# Patient Record
Sex: Female | Born: 1970 | Race: White | Hispanic: No | Marital: Married | Smoking: Current every day smoker
Health system: Southern US, Community
[De-identification: ages and names within clinical notes are randomized; demographics above are authoritative.]

## PROBLEM LIST (undated history)

## (undated) DIAGNOSIS — G43909 Migraine, unspecified, not intractable, without status migrainosus: Secondary | ICD-10-CM

## (undated) DIAGNOSIS — R51 Headache: Secondary | ICD-10-CM

## (undated) DIAGNOSIS — Z720 Tobacco use: Secondary | ICD-10-CM

## (undated) DIAGNOSIS — E039 Hypothyroidism, unspecified: Secondary | ICD-10-CM

## (undated) DIAGNOSIS — R519 Headache, unspecified: Secondary | ICD-10-CM

## (undated) DIAGNOSIS — C73 Malignant neoplasm of thyroid gland: Secondary | ICD-10-CM

## (undated) DIAGNOSIS — K219 Gastro-esophageal reflux disease without esophagitis: Secondary | ICD-10-CM

## (undated) DIAGNOSIS — C801 Malignant (primary) neoplasm, unspecified: Secondary | ICD-10-CM

## (undated) HISTORY — DX: Migraine, unspecified, not intractable, without status migrainosus: G43.909

## (undated) HISTORY — DX: Tobacco use: Z72.0

## (undated) HISTORY — DX: Malignant (primary) neoplasm, unspecified: C80.1

## (undated) HISTORY — DX: Hypothyroidism, unspecified: E03.9

## (undated) HISTORY — PX: ABDOMINAL HYSTERECTOMY: SHX81

## (undated) HISTORY — DX: Malignant neoplasm of thyroid gland: C73

## (undated) HISTORY — PX: DILATION AND CURETTAGE OF UTERUS: SHX78

## (undated) HISTORY — PX: KNEE ARTHROSCOPY: SUR90

---

## 1999-08-01 DIAGNOSIS — C73 Malignant neoplasm of thyroid gland: Secondary | ICD-10-CM

## 1999-08-01 HISTORY — PX: OTHER SURGICAL HISTORY: SHX169

## 1999-08-01 HISTORY — DX: Malignant neoplasm of thyroid gland: C73

## 2006-07-27 ENCOUNTER — Emergency Department: Payer: Self-pay | Admitting: Emergency Medicine

## 2008-11-24 ENCOUNTER — Ambulatory Visit: Payer: Self-pay | Admitting: Unknown Physician Specialty

## 2010-09-24 ENCOUNTER — Ambulatory Visit: Payer: Self-pay | Admitting: Internal Medicine

## 2011-05-30 ENCOUNTER — Ambulatory Visit: Payer: Self-pay | Admitting: Unknown Physician Specialty

## 2011-09-27 ENCOUNTER — Ambulatory Visit: Payer: Self-pay | Admitting: Family Medicine

## 2011-09-27 LAB — RAPID STREP-A WITH REFLX: Micro Text Report: NEGATIVE

## 2011-09-30 LAB — BETA STREP CULTURE(ARMC)

## 2011-12-02 ENCOUNTER — Ambulatory Visit: Payer: Self-pay

## 2012-07-21 ENCOUNTER — Ambulatory Visit: Payer: Self-pay | Admitting: Emergency Medicine

## 2012-07-21 LAB — RAPID STREP-A WITH REFLX: Micro Text Report: NEGATIVE

## 2012-07-23 LAB — BETA STREP CULTURE(ARMC)

## 2012-10-23 ENCOUNTER — Ambulatory Visit (INDEPENDENT_AMBULATORY_CARE_PROVIDER_SITE_OTHER): Payer: 59 | Admitting: Family Medicine

## 2012-10-23 ENCOUNTER — Other Ambulatory Visit (HOSPITAL_COMMUNITY)
Admission: RE | Admit: 2012-10-23 | Discharge: 2012-10-23 | Disposition: A | Discharge status: Home / Self Care | Payer: 59 | Source: Ambulatory Visit | Attending: Family Medicine | Admitting: Family Medicine

## 2012-10-23 ENCOUNTER — Encounter: Payer: Self-pay | Admitting: Family Medicine

## 2012-10-23 VITALS — BP 120/72 | HR 94 | Temp 98.3°F | Ht 67.0 in | Wt 186.8 lb

## 2012-10-23 DIAGNOSIS — C73 Malignant neoplasm of thyroid gland: Secondary | ICD-10-CM

## 2012-10-23 DIAGNOSIS — Z0000 Encounter for general adult medical examination without abnormal findings: Secondary | ICD-10-CM

## 2012-10-23 DIAGNOSIS — Z720 Tobacco use: Secondary | ICD-10-CM

## 2012-10-23 DIAGNOSIS — Z1239 Encounter for other screening for malignant neoplasm of breast: Secondary | ICD-10-CM

## 2012-10-23 DIAGNOSIS — F17200 Nicotine dependence, unspecified, uncomplicated: Secondary | ICD-10-CM

## 2012-10-23 DIAGNOSIS — Z01419 Encounter for gynecological examination (general) (routine) without abnormal findings: Secondary | ICD-10-CM

## 2012-10-23 DIAGNOSIS — E039 Hypothyroidism, unspecified: Secondary | ICD-10-CM

## 2012-10-23 NOTE — Progress Notes (Signed)
[facility] at [facility] [address] [place] [address] Phone: [phone] Fax: [phone]  Date:  10/23/2012   Name:  Angel Hernandez   DOB:  07-Jun-1971   MRN:  [id number] Gender: female Age: 42 y.o.  Primary Physician:  Hannah Beat, MD  Evaluating MD: Hannah Beat, MD   Chief Complaint: Establish Care   History of Present Illness:  Angel Hernandez is a 42 y.o. pleasant patient who presents with the following:  New Health Maint Exam:  Has had a lot of stress at work. Hired some family members and stressful to her.  Separated, but has a boyfriend.  4 children - 20, 17, 12, 9.  Working 4 - 3:30.  Health Maintenance Summary Reviewed and updated, unless pt declines services.  Tobacco History Reviewed. Smoker Alcohol: No concerns, no excessive use Exercise Habits: rare STD concerns: none Drug Use: None Menses regular: yes Lumps or breast concerns: occ able to express from breasts, had a mammogram a few years ago - neg Breast Cancer Family History: no  No health maintenance topics applied.  Labs reviewed with the patient.  Recently done at Dr. Rondel Baton  Patient Active Problem List  Diagnosis  . Hypothyroid  . Tobacco abuse    Past Medical History  Diagnosis Date  . Thyroid cancer 2001  . Hypothyroid   . Tobacco abuse     Past Surgical History  Procedure Laterality Date  . Thyroidectomy  2001  . Cesarean section      History   Social History  . Marital Status: Legally Separated    Spouse Name: N/A    Number of Children: N/A  . Years of Education: N/A   Occupational History  . 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 Smoking status: Current Every Day Smoker -- 0.50 packs/day    Types: Cigarettes  . Smokeless tobacco: Never Used  . Alcohol Use: No  . Drug Use: No  . Sexually Active: Yes -- Female partner(s)   Other Topics Concern  . Not on file   Social History Narrative   4 children  - 20, 59, 41, 23.   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  No Known Allergies  No current outpatient prescriptions on file prior to visit.   No current facility-administered medications on file prior to visit.     Review of Systems:   General: Denies fever, chills, sweats. No significant weight loss. Eyes: Denies blurring,significant itching ENT: Denies earache, sore throat, and hoarseness.  Cardiovascular: Denies chest pains, palpitations, dyspnea on exertion,  Respiratory: Denies cough, dyspnea at rest,wheeezing Breast: no concerns about lumps - as above, sometimes able to express GI: Denies nausea, vomiting, diarrhea, constipation, change in bowel habits, abdominal pain, melena, hematochezia GU: Denies dysuria, hematuria, urinary hesitancy, nocturia, denies STD risk, no concerns about discharge Musculoskeletal: Denies back pain, joint pain Derm: Denies rash, itching Neuro: Denies  paresthesias, frequent falls, frequent headaches Psych: Denies depression, anxiety Endocrine: Denies cold intolerance, heat intolerance, polydipsia Heme: Denies enlarged lymph nodes Allergy: No hayfever   Physical Examination: BP 120/72  Pulse 94  Temp(Src) 98.3 F (36.8 C) (Oral)  Ht 5\' 7"  (1.702 m)  Wt 186 lb 12 oz (84.709 kg)  BMI 29.24 kg/m2  SpO2 97%  LMP 10/14/2012  Ideal Body Weight: Weight in (lb) to have BMI =  25: 159.3   Wt Readings from Last 3 Encounters:  10/23/12 186 lb 12 oz (84.709 kg)    GEN: well developed, well nourished, no acute distress Eyes: conjunctiva and lids normal, PERRLA, EOMI ENT: TM clear, nares clear, oral exam WNL Neck: supple, no lymphadenopathy, no thyromegaly, no JVD Pulm: clear to auscultation and percussion, respiratory effort normal CV: regular rate and rhythm, S1-S2, no murmur, rub or gallop, no bruits Chest: no scars, masses, no lumps BREAST: no lumps, no axillary  LAD, no nipple discharge GI: soft, non-tender; no hepatosplenomegaly, masses; active bowel sounds all quadrants GU: Normal external female genitalia. Cervix appears intact without lesions or irritation. Vaginal canal normal without ulceration or lesion. Cervix NT to exam. Ovaries neither enlarged nor tender. Lymph: no cervical, axillary or inguinal adenopathy MSK: gait normal, muscle tone and strength WNL, no joint swelling, effusions, discoloration, crepitus  SKIN: clear, good turgor, color WNL, no rashes, lesions, or ulcerations Neuro: normal mental status, normal strength, sensation, and motion Psych: alert; oriented to person, place and time, normally interactive and not anxious or depressed in appearance.  Assessment and Plan:  Routine general medical examination at a health care facility  Encounter for routine gynecological examination - Plan: Cytology - PAP  Screening for malignant neoplasm of breast - Plan: MM Digital Screening  Hypothyroid  Tobacco abuse  Thyroid cancer  The patient's preventative maintenance and recommended screening tests for an annual wellness exam were reviewed in full today. Brought up to date unless services declined.  Counselled on the importance of diet, exercise, and its role in overall health and mortality. The patient's FH and SH was reviewed, including their home life, tobacco status, and drug and alcohol status.   Pap and breast exam With expressable sometimes from nipple and almost 42 - check mammo  Orders Today:  Orders Placed This Encounter  Procedures  . MM Digital Screening    Standing Status: Future     Number of Occurrences:      Standing Expiration Date: 12/23/2013    Order Specific Question:  Is the patient pregnant?    Answer:  No    Order Specific Question:  Preferred imaging location?    Answer:  External    Order Specific Question:  Reason for exam:    Answer:  pt preference, screening / breast    Updated Medication  List: (Includes new medications, updates to list, dose adjustments) Meds ordered this encounter  Medications  . levothyroxine (SYNTHROID, LEVOTHROID) 112 MCG tablet    Sig: Take 1 tablet by mouth daily.    Medications Discontinued: There are no discontinued medications.    Signed, Elpidio Galea. Osbaldo Mark, MD 10/23/2012 2:11 PM

## 2012-10-23 NOTE — Patient Instructions (Addendum)
REFERRAL: GO THE THE FRONT ROOM AT THE ENTRANCE OF OUR CLINIC, NEAR CHECK IN. ASK FOR MARION. SHE WILL HELP YOU SET UP YOUR REFERRAL. DATE: TIME:  

## 2012-10-23 NOTE — Progress Notes (Signed)
Error

## 2012-10-24 ENCOUNTER — Encounter: Payer: Self-pay | Admitting: Family Medicine

## 2012-10-24 DIAGNOSIS — E890 Postprocedural hypothyroidism: Secondary | ICD-10-CM | POA: Insufficient documentation

## 2012-10-24 DIAGNOSIS — C73 Malignant neoplasm of thyroid gland: Secondary | ICD-10-CM | POA: Insufficient documentation

## 2012-10-24 DIAGNOSIS — Z85850 Personal history of malignant neoplasm of thyroid: Secondary | ICD-10-CM | POA: Insufficient documentation

## 2012-10-24 DIAGNOSIS — E039 Hypothyroidism, unspecified: Secondary | ICD-10-CM | POA: Insufficient documentation

## 2012-10-24 DIAGNOSIS — Z720 Tobacco use: Secondary | ICD-10-CM | POA: Insufficient documentation

## 2012-10-28 ENCOUNTER — Encounter: Payer: Self-pay | Admitting: Family Medicine

## 2012-10-29 ENCOUNTER — Encounter: Payer: Self-pay | Admitting: *Deleted

## 2012-10-29 ENCOUNTER — Telehealth: Payer: Self-pay

## 2012-10-29 NOTE — Telephone Encounter (Signed)
Pt left v/m requesting call back for test results; [phone].

## 2012-10-30 ENCOUNTER — Telehealth: Payer: Self-pay | Admitting: *Deleted

## 2012-10-30 DIAGNOSIS — N6452 Nipple discharge: Secondary | ICD-10-CM

## 2012-10-30 NOTE — Telephone Encounter (Signed)
Orders only

## 2012-10-30 NOTE — Telephone Encounter (Signed)
Order only

## 2012-10-31 ENCOUNTER — Ambulatory Visit: Payer: Self-pay | Admitting: Family Medicine

## 2012-11-01 ENCOUNTER — Encounter: Payer: Self-pay | Admitting: Family Medicine

## 2012-11-04 ENCOUNTER — Encounter: Payer: Self-pay | Admitting: *Deleted

## 2012-11-06 ENCOUNTER — Other Ambulatory Visit: Payer: Self-pay | Admitting: Family Medicine

## 2012-11-06 DIAGNOSIS — Z13220 Encounter for screening for lipoid disorders: Secondary | ICD-10-CM

## 2012-11-06 DIAGNOSIS — R5381 Other malaise: Secondary | ICD-10-CM

## 2012-11-06 DIAGNOSIS — R5383 Other fatigue: Secondary | ICD-10-CM

## 2012-11-07 ENCOUNTER — Other Ambulatory Visit (INDEPENDENT_AMBULATORY_CARE_PROVIDER_SITE_OTHER): Payer: 59

## 2012-11-07 DIAGNOSIS — Z13220 Encounter for screening for lipoid disorders: Secondary | ICD-10-CM

## 2012-11-07 DIAGNOSIS — R5381 Other malaise: Secondary | ICD-10-CM

## 2012-11-07 LAB — CBC WITH DIFFERENTIAL/PLATELET
Basophils Absolute: 0.1 10*3/uL (ref 0.0–0.1)
Basophils Relative: 1 % (ref 0.0–3.0)
Eosinophils Absolute: 0.1 10*3/uL (ref 0.0–0.7)
Eosinophils Relative: 1.3 % (ref 0.0–5.0)
HCT: 41.2 % (ref 36.0–46.0)
Hemoglobin: 14.3 g/dL (ref 12.0–15.0)
Lymphocytes Relative: 25.5 % (ref 12.0–46.0)
Lymphs Abs: 1.9 10*3/uL (ref 0.7–4.0)
MCHC: 34.6 g/dL (ref 30.0–36.0)
MCV: 92.7 fl (ref 78.0–100.0)
Monocytes Absolute: 0.7 10*3/uL (ref 0.1–1.0)
Monocytes Relative: 9.6 % (ref 3.0–12.0)
Neutro Abs: 4.7 10*3/uL (ref 1.4–7.7)
Neutrophils Relative %: 62.6 % (ref 43.0–77.0)
Platelets: 287 10*3/uL (ref 150.0–400.0)
RBC: 4.44 Mil/uL (ref 3.87–5.11)
RDW: 12.3 % (ref 11.5–14.6)
WBC: 7.5 10*3/uL (ref 4.5–10.5)

## 2012-11-07 LAB — LIPID PANEL
Cholesterol: 176 mg/dL (ref 0–200)
HDL: 31.7 mg/dL — ABNORMAL LOW (ref >39.00)
LDL Cholesterol: 131 mg/dL — ABNORMAL HIGH (ref 0–99)
Total CHOL/HDL Ratio: 6
Triglycerides: 68 mg/dL (ref 0.0–149.0)
VLDL: 13.6 mg/dL (ref 0.0–40.0)

## 2012-11-07 LAB — HEPATIC FUNCTION PANEL
ALT: 16 U/L (ref 0–35)
AST: 15 U/L (ref 0–37)
Albumin: 4.1 g/dL (ref 3.5–5.2)
Alkaline Phosphatase: 58 U/L (ref 39–117)
Bilirubin, Direct: 0.1 mg/dL (ref 0.0–0.3)
Total Bilirubin: 0.5 mg/dL (ref 0.3–1.2)
Total Protein: 7.1 g/dL (ref 6.0–8.3)

## 2012-11-07 LAB — BASIC METABOLIC PANEL
BUN: 10 mg/dL (ref 6–23)
CO2: 27 mEq/L (ref 19–32)
Calcium: 9.1 mg/dL (ref 8.4–10.5)
Chloride: 106 mEq/L (ref 96–112)
Creatinine, Ser: 0.7 mg/dL (ref 0.4–1.2)
GFR: 92.57 mL/min (ref >60.00)
Glucose, Bld: 90 mg/dL (ref 70–99)
Potassium: 4.4 mEq/L (ref 3.5–5.1)
Sodium: 138 mEq/L (ref 135–145)

## 2012-11-13 ENCOUNTER — Encounter: Payer: 59 | Admitting: Family Medicine

## 2013-03-15 ENCOUNTER — Ambulatory Visit: Payer: Self-pay | Admitting: Family Medicine

## 2013-04-08 ENCOUNTER — Ambulatory Visit (INDEPENDENT_AMBULATORY_CARE_PROVIDER_SITE_OTHER): Payer: 59 | Admitting: Internal Medicine

## 2013-04-08 ENCOUNTER — Encounter: Payer: Self-pay | Admitting: Internal Medicine

## 2013-04-08 VITALS — BP 138/80 | HR 88 | Temp 98.0°F | Wt 168.0 lb

## 2013-04-08 DIAGNOSIS — B029 Zoster without complications: Secondary | ICD-10-CM

## 2013-04-08 DIAGNOSIS — B0229 Other postherpetic nervous system involvement: Secondary | ICD-10-CM

## 2013-04-08 MED ORDER — GABAPENTIN 100 MG PO CAPS: 100.0000 mg | ORAL_CAPSULE | Freq: Three times a day (TID) | ORAL | Status: DC

## 2013-04-08 NOTE — Patient Instructions (Signed)
Postherpetic Neuralgia Shingles is a painful disease. It is caused by the herpes zoster virus. This is the same virus which also causes chickenpox. It can affect the torso, limbs, or the face. For most people, shingles is a condition of rather sudden onset. Pain usually lasts about 1 month. In older patients, or patients with poor immune systems, a painful, long-standing (chronic) condition called postherpetic neuralgia can develop. This condition rarely happens before age 50. But at least 50% of people over 50 become affected following an attack of shingles. There is a natural tendency for this condition to improve over time with no treatment. Less than 5% of patients have pain that lasts for more than 1 year. DIAGNOSIS  Herpes is usually easily diagnosed on physical exam. Pain sometimes follows when the skin sores (lesions) have disappeared. It is called postherpetic neuralgia. That name simply means the pain that follows herpes. TREATMENT   Treating this condition may be difficult. Usually one of the tricyclic antidepressants, often amitriptyline, is the first line of treatment. There is evidence that the sooner these medications are given, the more likely they are to reduce pain.  Conventional analgesics, regional nerve blocks, and anticonvulsants have little benefit in most cases when used alone. Other tricyclic anti-depressants are used as a second option if the first antidepressant is unsuccessful.  Anticonvulsants, including carbamazepine, have been found to provide some added benefit when used with a tricyclic anti-depressant. This is especially for the stabbing type of pain similar to that of trigeminal neuralgia.  Chronic opioid therapy. This is a strong narcotic pain medication. It is used to treat pain that is resistant to other measures. The issues of dependency and tolerance can be reduced with closely managed care.  Some cream treatments are applied locally to the affected area. They  can help when used with other treatments. Their use may be difficult in the case of postherpetic trigeminal neuralgia. This is involved with the face. So the substances can irritate the eye and the skin around the eye. Examples of creams used include Capsaicin and lidocaine creams.  For shingles, antiviral therapies along with analgesics are recommended. Studies of the effect of anti-viral agents such as acyclovir on shingles have been done. They show improved rates of healing and decreased severity of sudden (acute) pain. Some observations suggest that nerve blocks during shingles infection will:  Reduce pain.  Shorten the acute episode.  Prevent the emergence of postherpetic neuralgia. Viral medications used include Acyclovir (Zovirax), Valacyclovir, Famciclovir and a lysine diet. Document Released: 10/07/2002 Document Revised: 10/09/2011 Document Reviewed: 07/17/2005 ExitCare Patient Information 2014 [employer].  

## 2013-04-08 NOTE — Progress Notes (Signed)
Subjective:    Patient ID: Angel Hernandez, female    DOB: [birth year], 42 y.o.   MRN: [id number]  HPI  Pt presents to the clinic today for Urgent Care follow up. She went to UC on 03/15/2013. She was diagnosed with shingles. She was treated with Valtrex for 7 days. The rash went away but she continues to have sharp, stabbing pain around where the rash is. She also reports generalized tingling from head to toe. She has taken Ibuprofen which did help ease the pain some. She has also been taking tramadol which has helped. She denies recurrent rash, fever, chills, nausea or vomiting.  Review of Systems      Past Medical History  Diagnosis Date  . Thyroid cancer 2001  . Hypothyroid   . Tobacco abuse     Current Outpatient Prescriptions  Medication Sig Dispense Refill  . levothyroxine (SYNTHROID, LEVOTHROID) 112 MCG tablet Take 1 tablet by mouth daily.       No current facility-administered medications for this visit.    No Known Allergies  History reviewed. No pertinent family history.  History   Social History  . Marital Status: Legally Separated    Spouse Name: N/A    Number of Children: N/A  . Years of Education: N/A   Occupational History  . 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 Smoking status: Current Every Day Smoker -- 0.50 packs/day    Types: Cigarettes  . Smokeless tobacco: Never Used  . Alcohol Use: No  . Drug Use: No  . Sexual Activity: Yes    Partners: Male   Other Topics Concern  . Not on file   Social History Narrative   4 children - 20, 90, 38, 68.   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  Respiratory: Denies difficulty breathing, shortness of breath, cough or sputum production.   Cardiovascular: Denies chest pain, chest tightness, palpitations or swelling in the hands or feet.    Skin: Denies redness, rashes, lesions or ulcercations.  Neurological: Pt reports shooting pain in back and generalized tingling around where the rash was. Denies dizziness, difficulty with memory, difficulty with speech or problems with balance and coordination.   No other specific complaints in a complete review of systems (except as listed in HPI above).  Objective:   Physical Exam  BP 138/80  Pulse 88  Temp(Src) 98 F (36.7 C)  Wt 168 lb (76.204 kg)  BMI 26.31 kg/m2 Wt Readings from Last 3 Encounters:  04/08/13 168 lb (76.204 kg)  10/23/12 186 lb 12 oz (84.709 kg)    General: Appears her stated age, well developed, well nourished in NAD. Skin: Warm, dry and intact. No rashes, lesions or ulcerations noted. Cardiovascular: Normal rate and rhythm. S1,S2 noted.  No murmur, rubs or gallops noted. No JVD or BLE edema. No carotid bruits noted. Pulmonary/Chest: Normal effort and positive vesicular breath sounds. No respiratory distress. No wheezes, rales or ronchi noted.  Musculoskeletal: Normal range of motion. No signs of joint swelling. No difficulty with gait.  Neurological: Alert and oriented. Cranial nerves II-XII intact. Coordination normal. +DTRs bilaterally.   BMET    Component Value Date/Time   NA 138 11/07/2012 0903   K 4.4 11/07/2012 0903   CL 106 11/07/2012 0903   CO2 27 11/07/2012 0903  GLUCOSE 90 11/07/2012 0903   BUN 10 11/07/2012 0903   CREATININE 0.7 11/07/2012 0903   CALCIUM 9.1 11/07/2012 0903    Lipid Panel     Component Value Date/Time   CHOL 176 11/07/2012 0903   TRIG 68.0 11/07/2012 0903   HDL 31.70* 11/07/2012 0903   CHOLHDL 6 11/07/2012 0903   VLDL 13.6 11/07/2012 0903   LDLCALC 131* 11/07/2012 0903    CBC    Component Value Date/Time   WBC 7.5 11/07/2012 0903   RBC 4.44 11/07/2012 0903   HGB 14.3 11/07/2012 0903   HCT 41.2 11/07/2012 0903   PLT 287.0 11/07/2012 0903   MCV 92.7 11/07/2012 0903   MCHC 34.6 11/07/2012 0903   RDW 12.3 11/07/2012 0903    LYMPHSABS 1.9 11/07/2012 0903   MONOABS 0.7 11/07/2012 0903   EOSABS 0.1 11/07/2012 0903   BASOSABS 0.1 11/07/2012 0903    Hgb A1C No results found for this basename: HGBA1C         Assessment & Plan:   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

## 2013-10-01 ENCOUNTER — Encounter: Payer: Self-pay | Admitting: *Deleted

## 2013-10-01 ENCOUNTER — Telehealth: Payer: Self-pay | Admitting: Family Medicine

## 2013-10-01 ENCOUNTER — Encounter: Payer: Self-pay | Admitting: Family Medicine

## 2013-10-01 ENCOUNTER — Ambulatory Visit (INDEPENDENT_AMBULATORY_CARE_PROVIDER_SITE_OTHER): Payer: 59 | Admitting: Family Medicine

## 2013-10-01 VITALS — BP 118/84 | HR 77 | Temp 98.5°F | Ht 67.0 in | Wt 171.5 lb

## 2013-10-01 DIAGNOSIS — C73 Malignant neoplasm of thyroid gland: Secondary | ICD-10-CM

## 2013-10-01 DIAGNOSIS — J069 Acute upper respiratory infection, unspecified: Secondary | ICD-10-CM

## 2013-10-01 DIAGNOSIS — B001 Herpesviral vesicular dermatitis: Secondary | ICD-10-CM

## 2013-10-01 DIAGNOSIS — E039 Hypothyroidism, unspecified: Secondary | ICD-10-CM

## 2013-10-01 DIAGNOSIS — B009 Herpesviral infection, unspecified: Secondary | ICD-10-CM

## 2013-10-01 LAB — TSH: TSH: 0.19 u[IU]/mL — ABNORMAL LOW (ref 0.35–5.50)

## 2013-10-01 LAB — T3, FREE: T3, Free: 2.4 pg/mL (ref 2.3–4.2)

## 2013-10-01 LAB — T4, FREE: Free T4: 1.09 ng/dL (ref 0.60–1.60)

## 2013-10-01 MED ORDER — VALACYCLOVIR HCL 500 MG PO TABS: ORAL_TABLET | ORAL | Status: DC

## 2013-10-01 MED ORDER — LEVOTHYROXINE SODIUM 112 MCG PO TABS: 112.0000 ug | ORAL_TABLET | Freq: Every day | ORAL | Status: DC

## 2013-10-01 NOTE — Patient Instructions (Signed)
REFERRALS TO SPECIALISTS, SPECIAL TESTS (MRI, CT, ULTRASOUNDS)  GO THE WAITING ROOM AND TELL CHECK IN YOU NEED HELP WITH A REFERRAL. Either Angel Hernandez or Angel Hernandez will help you set it up.  If it is between 1-2 PM they may be at lunch.  After 5 PM, they will likely be at home.  They will call you, so please make sure the office has your correct phone number.  Referrals sometimes can be done same day if urgent, but others can take 2 or 3 days to get an appointment. Starting in 2015, some of the new Medicare insurance plans and Affordable Care Health plans offered on the Exchange take longer for referrals. They have added additional paperwork and steps.  MRI's and CT's can take up to a week for the test. (Emergencies like strokes take precedence. I will tell you if you have an emergency.)   Specialist appointment times vary a great deal, mostly on the specialist's schedule and if they have openings. -- Our office tries to get you in as fast as possible. -- Some specialists have very long wait times. (Example. Dermatology. Usually months) -- If you have a true emergency like new cancer, we work to get you in ASAP.   

## 2013-10-01 NOTE — Telephone Encounter (Signed)
Relevant patient education assigned to patient using Emmi. ° °

## 2013-10-01 NOTE — Progress Notes (Signed)
Date:  10/01/2013   Name:  Angel Hernandez   DOB:  1970/09/22   MRN:  [id number]  Primary Physician:  Owens Loffler, MD   Chief Complaint: Sinusitis, Chest Congestion and Medication Refill   Subjective:   History of Present Illness:  Angel Hernandez is a 43 y.o. pleasant patient who presents with the following:  No thyroid check since 1 year 14 years post cancer New endocrinologist referral - reports Dr. Sabra Heck is no longer doing endocrine.  Had really bad illness, and now feeling better. Having a little bit better day. Had some runny nose, cough and congestion.  Upper back pain, cases and cases of back pain.  Walking some at the gym.   Keeps getting fever blister.    Patient Active Problem List   Diagnosis Date Noted  . 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 Hypothyroid   . Tobacco abuse   . Thyroid cancer     Past Medical History  Diagnosis Date  . Thyroid cancer 2001  . Hypothyroid   . Tobacco abuse     Past Surgical History  Procedure Laterality Date  . Thyroidectomy  2001  . Cesarean section      History   Social History  . Marital Status: Legally Separated    Spouse Name: N/A    Number of Children: N/A  . Years of Education: N/A   Occupational History  . Surveyor, [employer] in [place] History Main Topics  . Smoking status: Current Every Day Smoker -- 0.50 packs/day    Types: Cigarettes  . Smokeless tobacco: Never Used  . Alcohol Use: No  . Drug Use: No  . Sexual Activity: Yes    Partners: Male   Other Topics Concern  . Not on file   Social History Narrative   4 children - 20, 84, 22, 24.   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  No Known Allergies  Medication list has been reviewed and updated.  Review of Systems:   GEN: resolving uri and sinus pressure GI: No n/v/d, eating normally Pulm: No SOB Interactive and getting along well at  home.  Otherwise, ROS is as per the HPI.  Objective:   Physical Examination: BP 118/84  Pulse 77  Temp(Src) 98.5 F (36.9 C) (Oral)  Ht 5\' 7"  (1.702 m)  Wt 171 lb 8 oz (77.792 kg)  BMI 26.85 kg/m2  SpO2 98%  LMP 09/06/2013  Ideal Body Weight: Weight in (lb) to have BMI = 25: 159.3   Gen: WDWN, NAD; A & O x3, cooperative. Pleasant.Globally Non-toxic HEENT: Normocephalic and atraumatic. Throat clear, w/o exudate, R TM clear, L TM - good landmarks, No fluid present. Minimal rhinnorhea.  MMM Frontal sinuses: NT Max sinuses: NT NECK: Anterior cervical  LAD is absent CV: RRR, No M/G/R, cap refill <2 sec PULM: Breathing comfortably in no respiratory distress. no wheezing, crackles, rhonchi EXT: No c/c/e PSYCH: Friendly, good eye contact MSK: Nml gait   Range of motion at  the waist: Flexion: normal Extension: normal Lateral bending: normal Rotation: all normal  No echymosis or edema Rises to examination table with no difficulty Gait: non antalgic  Inspection/Deformity: N Paraspinus Tenderness: mild, thoracic to upper lumbar  neurovasc intact  Laboratory and Imaging Data: Results for orders placed in visit on 10/01/13  T4, FREE      Result  Value Ref Range   Free T4 1.09  0.60 - 1.60 ng/dL  T3, FREE      Result Value Ref Range   T3, Free 2.4  2.3 - 4.2 pg/mL  TSH      Result Value Ref Range   TSH 0.19 (*) 0.35 - 5.50 uIU/mL     Assessment & Plan:   Unspecified hypothyroidism - Plan: Ambulatory referral to Endocrinology, T4, free, T3, free, TSH  Thyroid cancer - Plan: Ambulatory referral to Endocrinology  URI (upper respiratory infection)  Recurrent cold sores  Refill synthroid. Trial of valtrex  Consult new endocine.  Given core and back stability program from [facility] Prescriptions   VALACYCLOVIR (VALTREX) 500 MG TABLET    4 tabs po bid prn as directed   Orders Placed This Encounter  Procedures  . T4, free  . T3, free  . TSH  .  Ambulatory referral to Endocrinology   Signed,  Maud Deed. Evelisse Szalkowski, MD, [place] at [facility] [place] [address] Phone: [phone] Fax: [phone]  Patient Instructions  REFERRALS TO SPECIALISTS, SPECIAL TESTS (MRI, CT, ULTRASOUNDS)  GO THE WAITING ROOM AND TELL CHECK IN YOU NEED HELP WITH A REFERRAL. Either MARION or LINDA will help you set it up.  If it is between 1-2 PM they may be at lunch.  After 5 PM, they will likely be at home.  They will call you, so please make sure the office has your correct phone number.  Referrals sometimes can be done same day if urgent, but others can take 2 or 3 days to get an appointment. Starting in 2015, some of the new Medicare insurance plans and [place] offered on the Exchange take longer for referrals. They have added additional paperwork and steps.  MRI's and CT's can take up to a week for the test. (Emergencies like strokes take precedence. I will tell you if you have an emergency.)   Specialist appointment times vary a great deal, mostly on the specialist's schedule and if they have openings. -- Our office tries to get you in as fast as possible. -- Some specialists have very long wait times. (Example. Dermatology. Usually months) -- If you have a true emergency like new cancer, we work to get you in ASAP.    Patient's Medications  New Prescriptions   VALACYCLOVIR (VALTREX) 500 MG TABLET    4 tabs po bid prn as directed  Previous Medications   No medications on file  Modified Medications   Modified Medication Previous Medication   LEVOTHYROXINE (SYNTHROID, LEVOTHROID) 112 MCG TABLET levothyroxine (SYNTHROID, LEVOTHROID) 112 MCG tablet      Take 1 tablet (112 mcg total) by mouth daily.    Take 1 tablet by mouth daily.  Discontinued Medications   GABAPENTIN (NEURONTIN) 100 MG CAPSULE    Take 1 capsule (100 mg total) by mouth 3 (three) times daily.

## 2013-10-01 NOTE — Progress Notes (Signed)
Pre visit review using our clinic review tool, if applicable. No additional management support is needed unless otherwise documented below in the visit note. 

## 2013-10-04 DIAGNOSIS — B001 Herpesviral vesicular dermatitis: Secondary | ICD-10-CM | POA: Insufficient documentation

## 2013-11-03 ENCOUNTER — Ambulatory Visit: Payer: Self-pay | Admitting: Physician Assistant

## 2013-11-03 LAB — CBC WITH DIFFERENTIAL/PLATELET
Basophil #: 0.1 10*3/uL (ref 0.0–0.1)
Basophil %: 0.7 %
Eosinophil #: 0.1 10*3/uL (ref 0.0–0.7)
Eosinophil %: 1.3 %
HCT: 42.1 % (ref 35.0–47.0)
HGB: 14 g/dL (ref 12.0–16.0)
Lymphocyte #: 2.5 10*3/uL (ref 1.0–3.6)
Lymphocyte %: 25.9 %
MCH: 31.1 pg (ref 26.0–34.0)
MCHC: 33.4 g/dL (ref 32.0–36.0)
MCV: 93 fL (ref 80–100)
Monocyte #: 0.7 x10 3/mm (ref 0.2–0.9)
Monocyte %: 7.5 %
Neutrophil #: 6.2 10*3/uL (ref 1.4–6.5)
Neutrophil %: 64.6 %
Platelet: 287 10*3/uL (ref 150–440)
RBC: 4.52 10*6/uL (ref 3.80–5.20)
RDW: 12.8 % (ref 11.5–14.5)
WBC: 9.5 10*3/uL (ref 3.6–11.0)

## 2013-11-03 LAB — MONONUCLEOSIS SCREEN: Mono Test: NEGATIVE

## 2013-12-26 IMAGING — MG MM CAD DIAGNOSTIC MAMMO
1 series · 8 of 8 positions shown · non-contrast
Comparison: none

REASON FOR EXAM: BILATERAL BR DISCHARGE
COMMENTS:

[R CC · right · 8 of 10 slices shown]
[im 1/10]
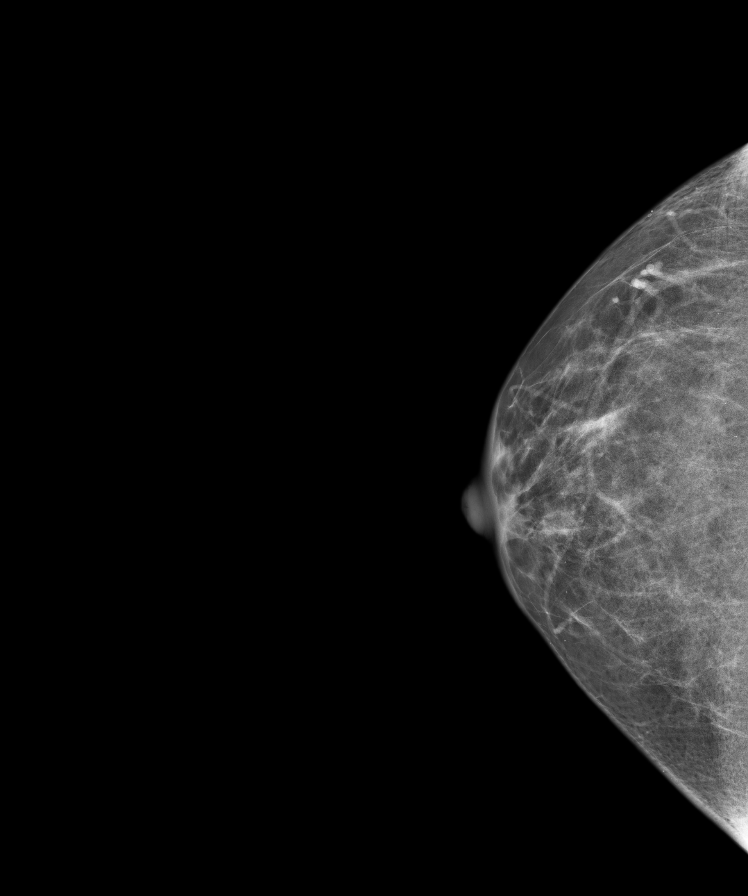
[im 2/10]
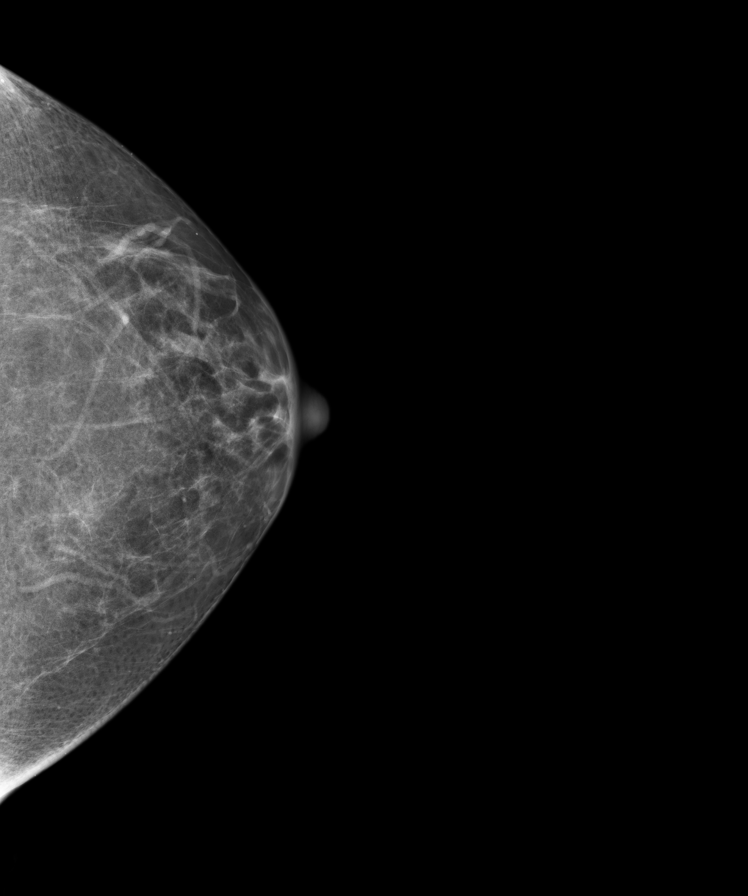
[im 3/10]
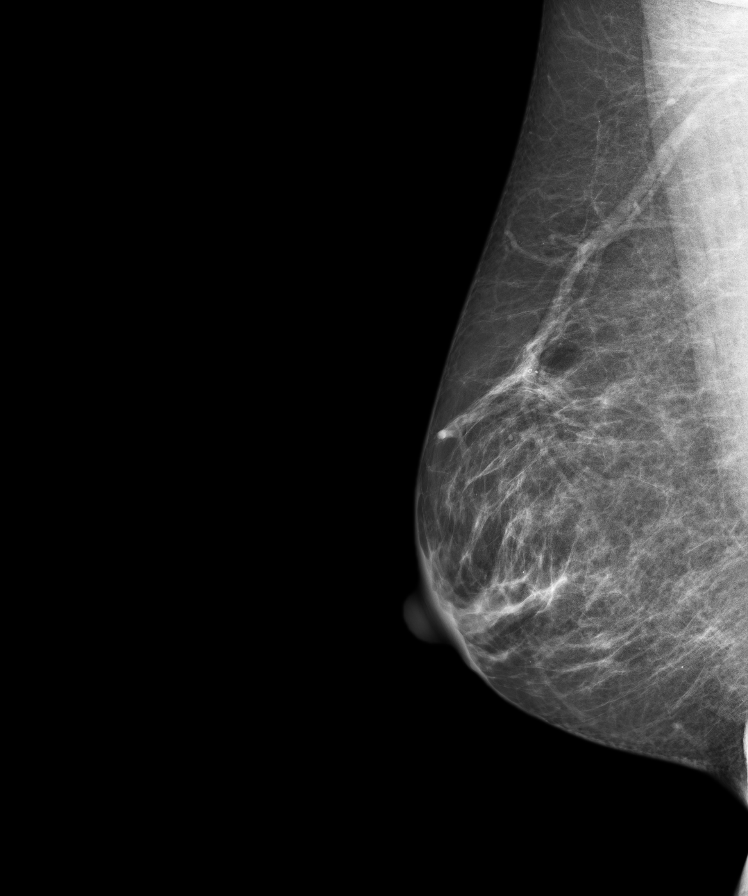
[im 4/10]
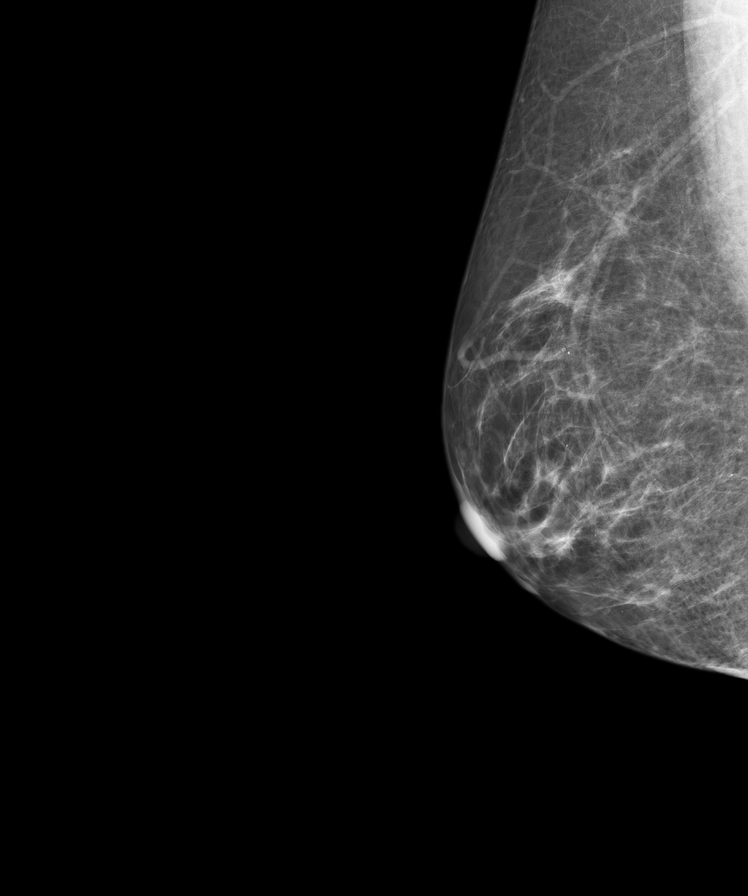
[im 6/10]
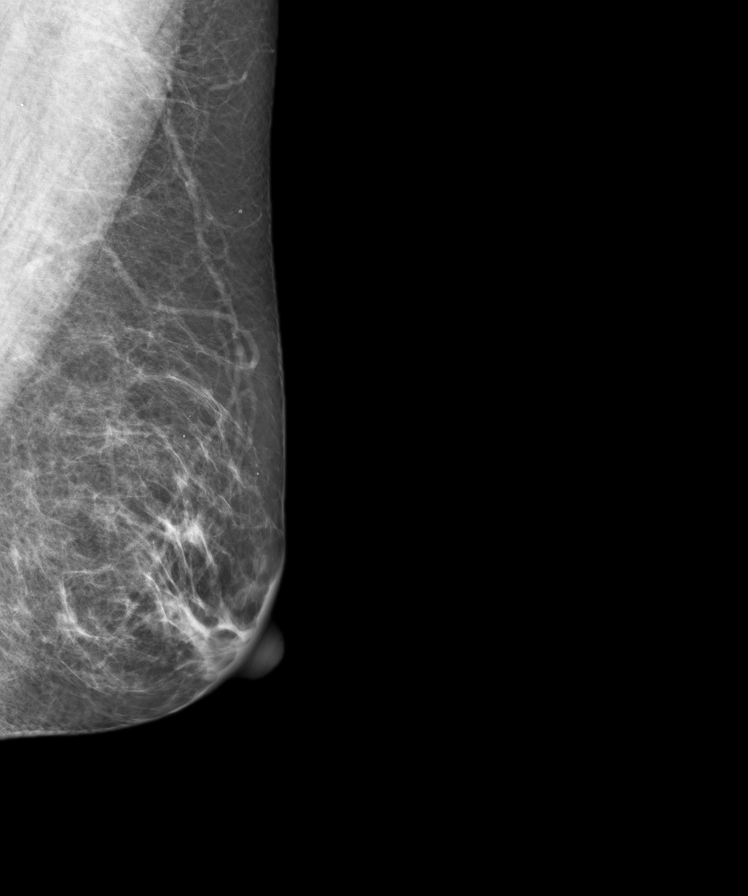
[im 7/10]
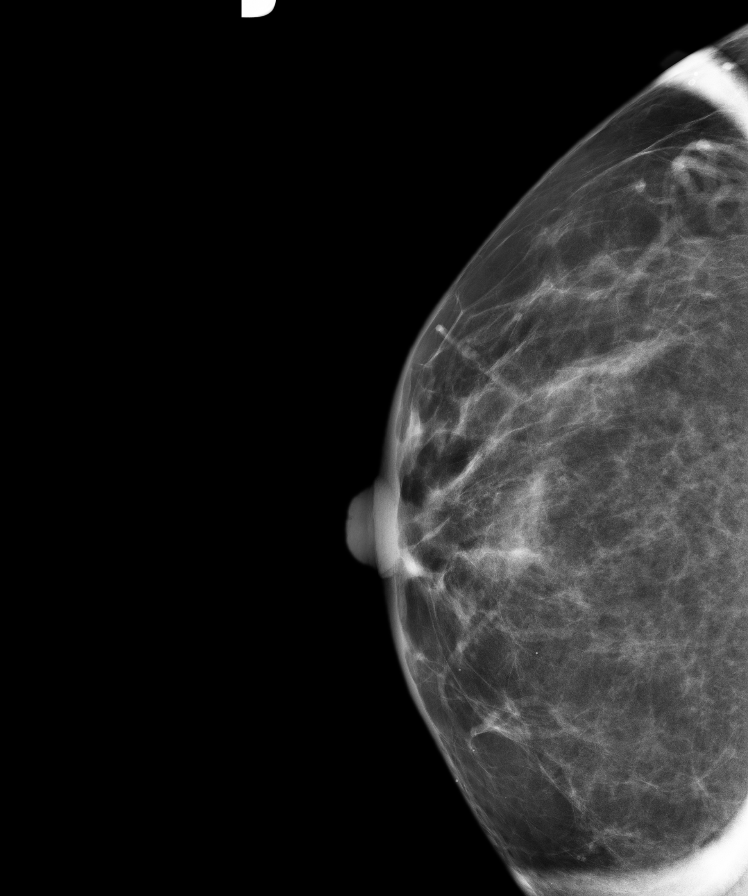
[im 8/10]
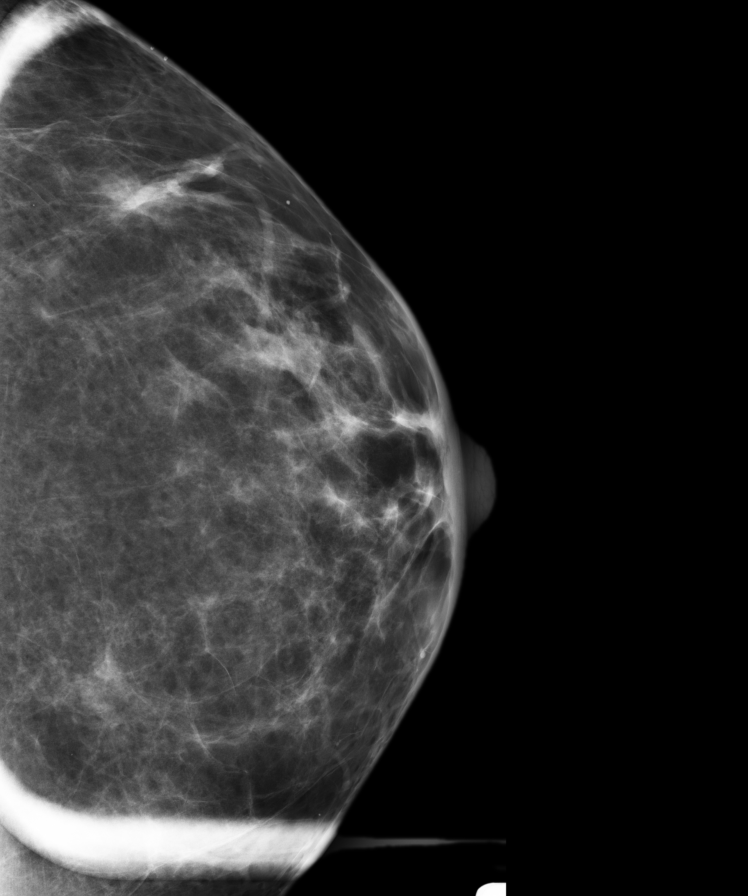
[im 10/10]
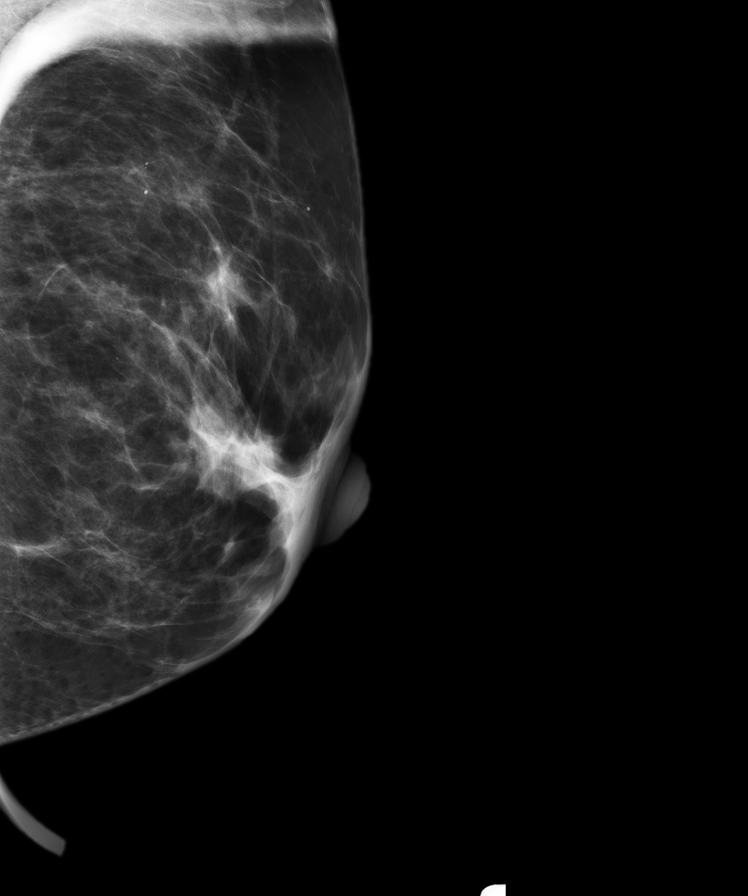

[8 of 8 positions shown; findings below may reference images not displayed]

PROCEDURE:     MAM - MAM DGTL DIAGNOSTIC MAMMO W/CAD  - October 31, 2012  [DATE]

RESULT:     Comparison is made to a previous digital study October 09, 2003.

Since the previous study the breasts have undergone involutional changes.
There is no dominant mass. There are no malignant appearing groupings of
microcalcification. There are no areas of architectural distortion.

At ultrasound in the lateral periareolar region there are 2 hypoechoic to
anechoic the structures most compatible with cysts containing debris
measuring 3 mm and 7 mm in diameter. There are no corresponding findings on
today's mammogram.
IMPRESSION: I do not see findings suspicious for malignancy.

BI-RADS 2: Benign findings.

Recommendation: please began yearly mammographic followup.

Continued clinical followup is needed. If the patient's breast discomfort
worsens or if the breast discharge becomes bloody, surgical consultation
would be recommended.

BREAST COMPOSITION: The breast composition is SCATTERED FIBROGLANDULAR
TISSUE (glandular tissue is 25-50%)

A NEGATIVE MAMMOGRAM REPORT DOES NOT PRECLUDE BIOPSY OR OTHER EVALUATION OF
A CLINICALLY PALPABLE OR OTHERWISE SUSPICIOUS MASS OR LESION. BREAST CANCER
MAY NOT BE DETECTED BY MAMMOGRAPHY IN UP TO 10% OF CASES.

Dictation site:1

## 2013-12-26 IMAGING — US US BREAST BILAT
1 series · 14 of 25 positions shown · non-contrast
Comparison: none

REASON FOR EXAM: BILATERAL BR DISCHARGE
COMMENTS:

[Series 1: us breast bilat · 0.09mm/px · 14 of 48 slices shown]
[im 1/48]
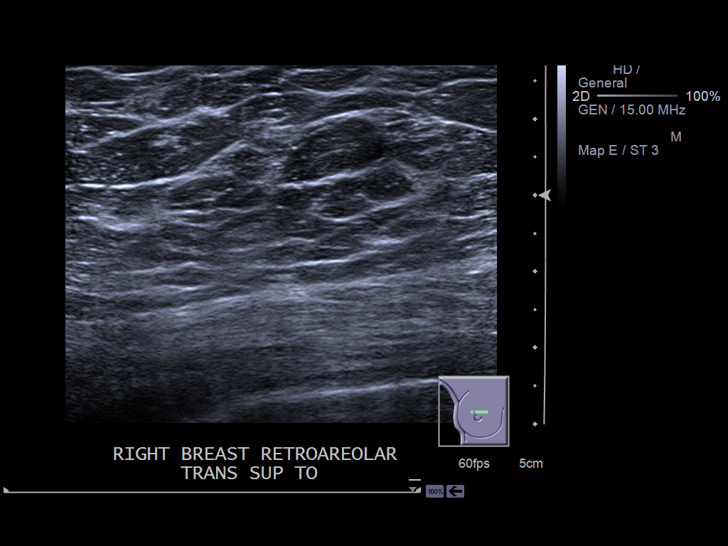
[im 4/48]
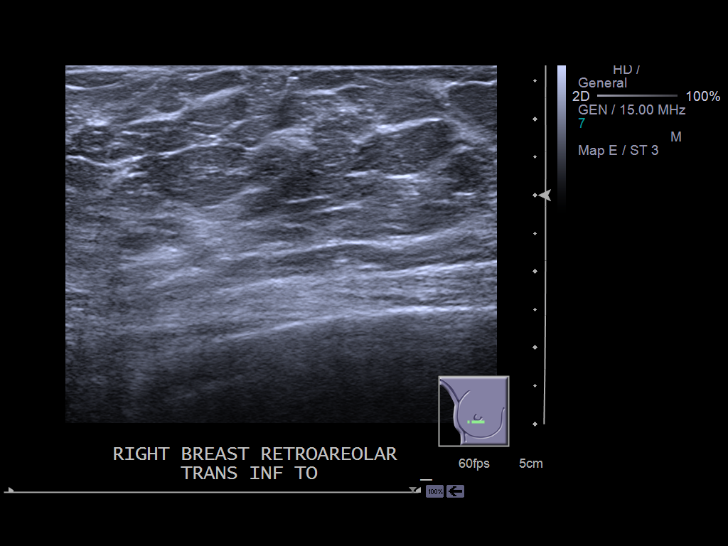
[im 8/48]
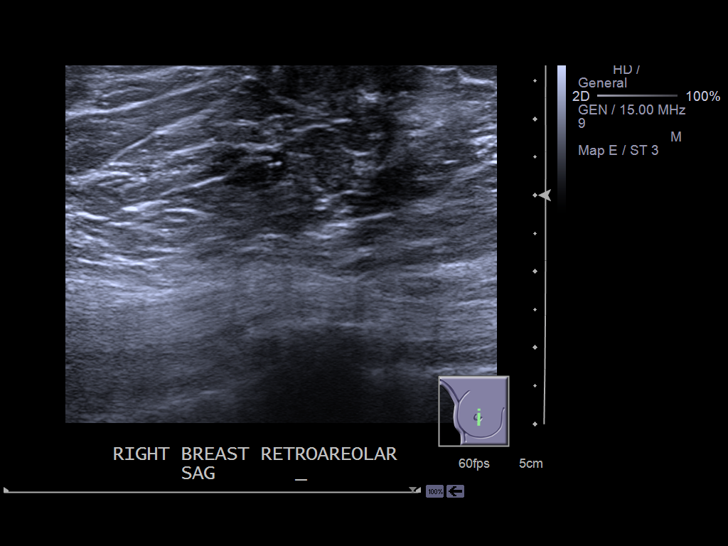
[im 12/48]
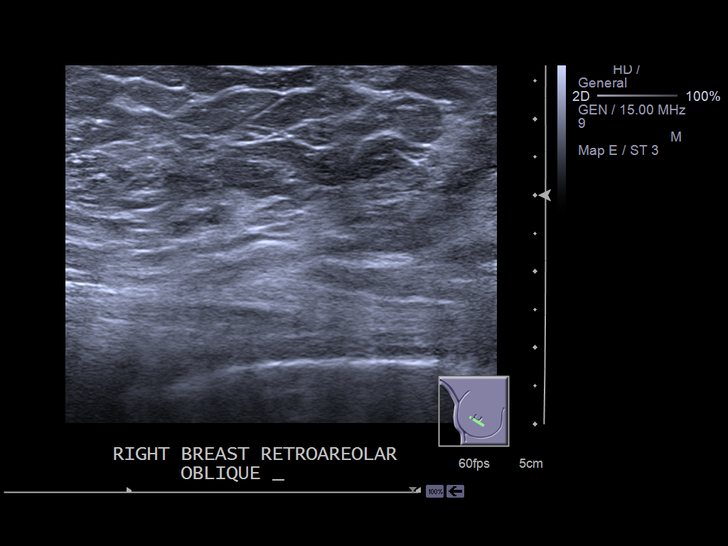
[im 16/48]
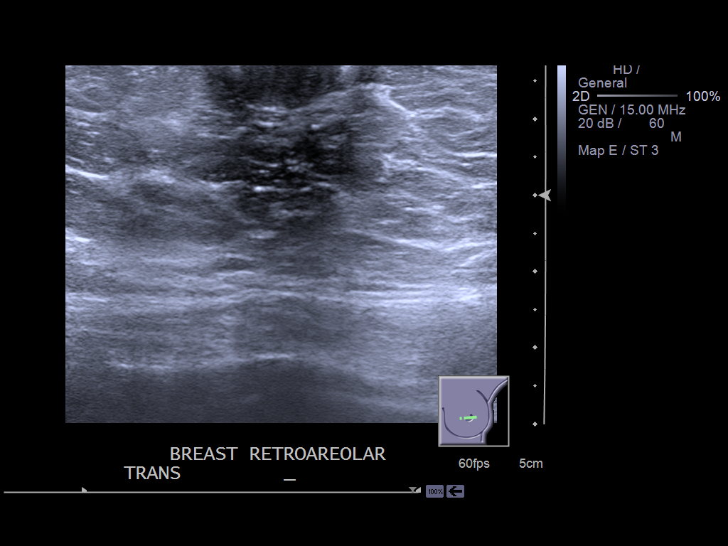
[im 18/48]
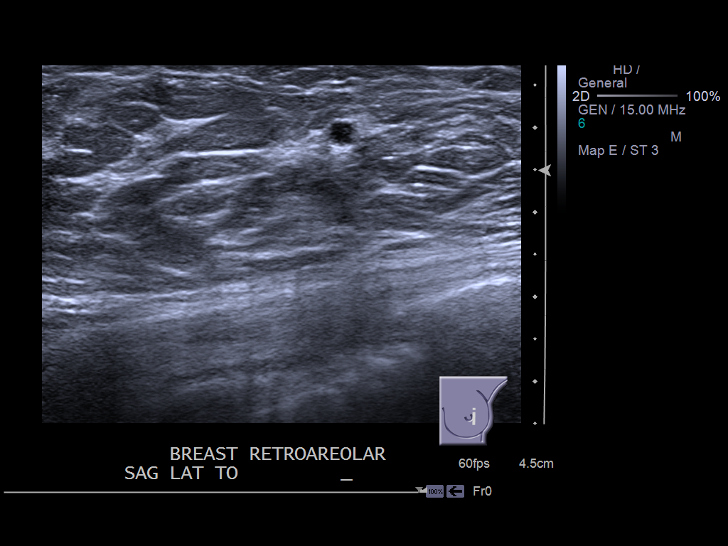
[im 22/48]
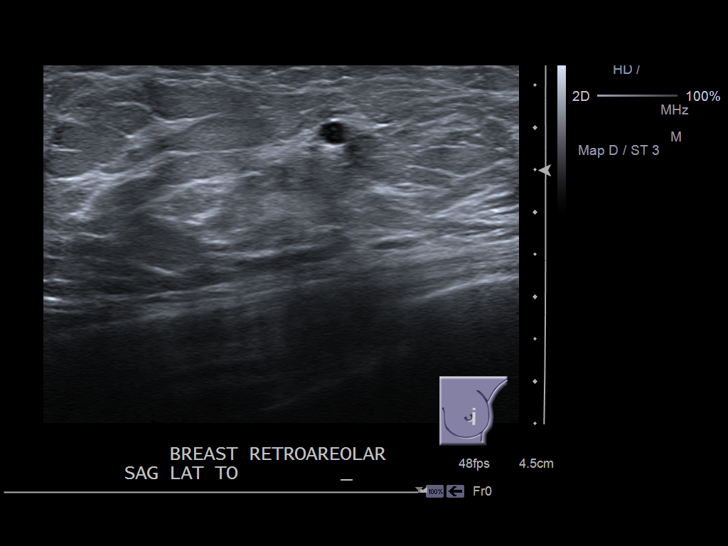
[im 26/48]
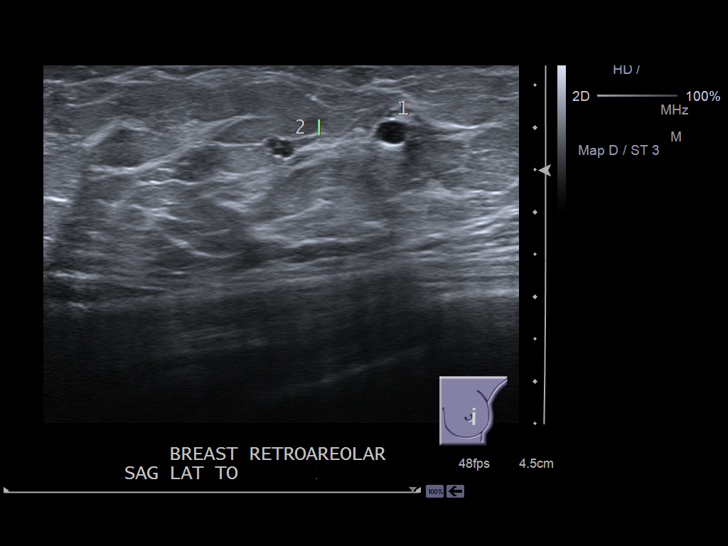
[im 30/48]
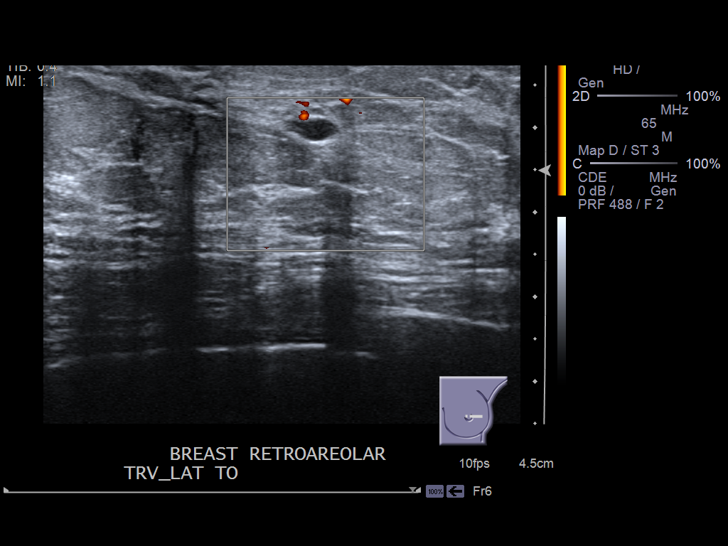
[im 32/48]
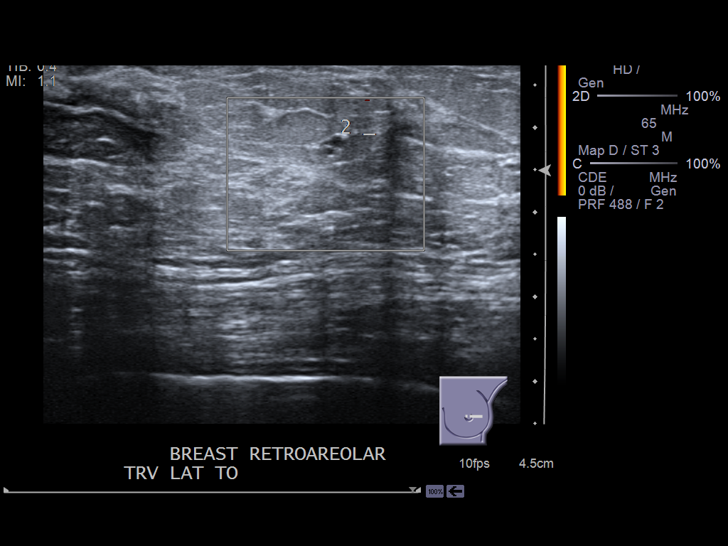
[im 36/48]
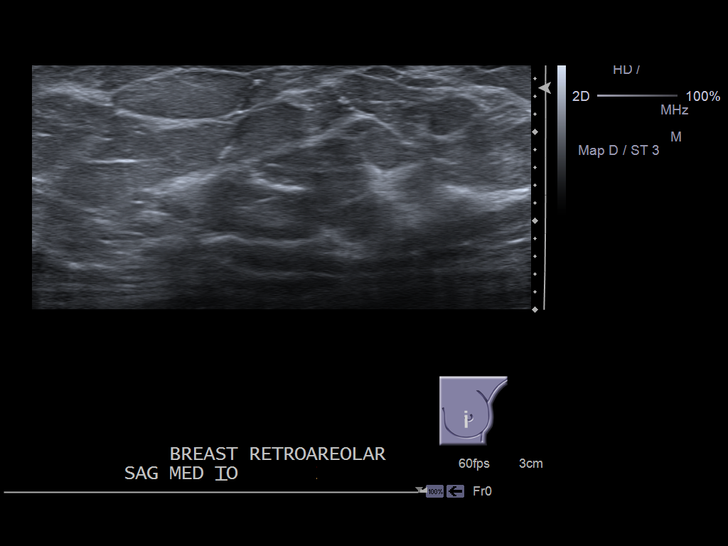
[im 40/48]
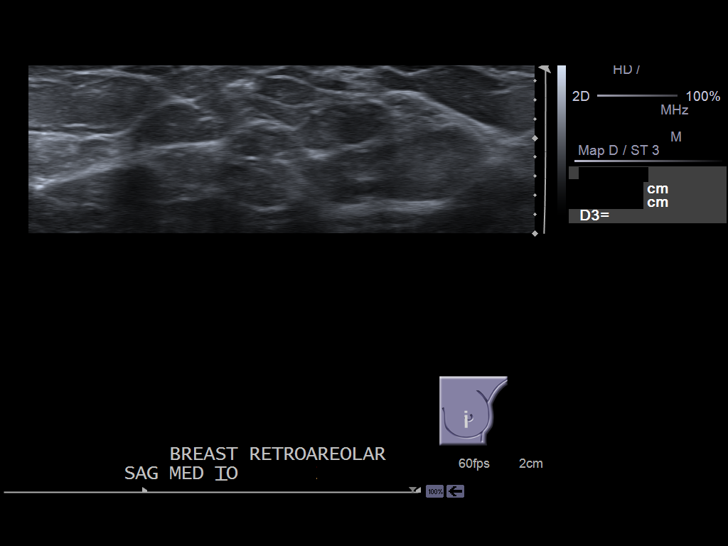
[im 44/48]
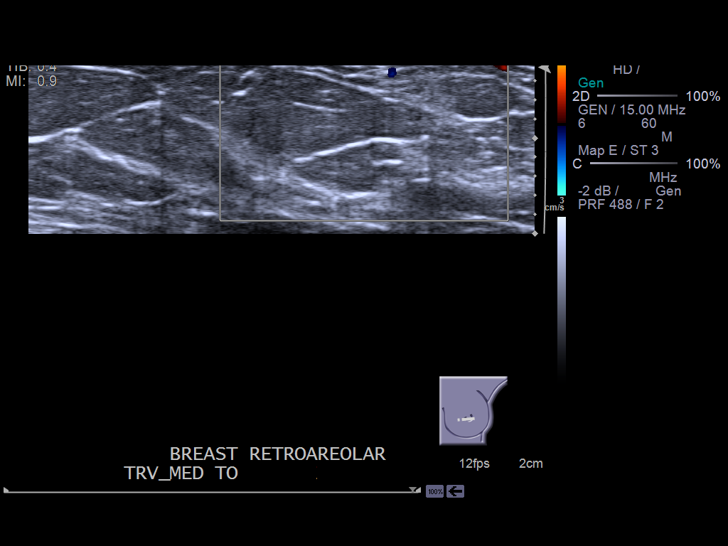
[im 48/48]
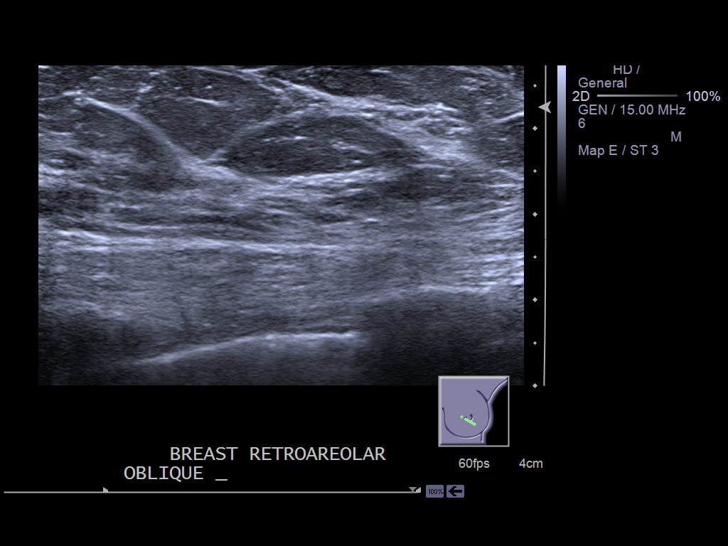

[14 of 25 positions shown; findings below may reference images not displayed]

PROCEDURE:     US  - US BREAST BILATERAL  - October 31, 2012  [DATE]

RESULT:     The patient underwent evaluation of the retroareolar regions
due to bilateral nipple discharge.

On the right the echotexture of the parenchyma in the retroareolar region is
normal. On the left there are two hypoechoic to anechoic structures which
measure 3 and 7 mm in diameter. These likely reflect tiny cysts. These do
not appear to reflect typical dilated ducts.
IMPRESSION: In the retroareolar region on the left there are two
hypoechoic nodules. One measures 7 mm in greatest dimension while the second
measures 3 mm in greatest dimension. These lie at approximately the [DATE]
position.

Please see the dictation of the diagnostic mammogram of today's date for
final recommendations and BI-RADS classification.

A NEGATIVE MAMMOGRAM REPORT DOES NOT PRECLUDE BIOPSY OR OTHER EVALUATION OF
A CLINICALLY PALPABLE OR OTHERWISE SUSPICIOUS MASS OR LESION. BREAST CANCER
MAY NOT BE DETECTED BY MAMMOGRAPHY IN UP TO 10% OF CASES.

Dictation site:1

## 2014-03-30 ENCOUNTER — Other Ambulatory Visit: Payer: Self-pay | Admitting: Family Medicine

## 2014-06-01 ENCOUNTER — Encounter: Payer: Self-pay | Admitting: Family Medicine

## 2014-06-04 ENCOUNTER — Ambulatory Visit (INDEPENDENT_AMBULATORY_CARE_PROVIDER_SITE_OTHER)
Admission: RE | Admit: 2014-06-04 | Discharge: 2014-06-04 | Disposition: A | Discharge status: Home / Self Care | Payer: 59 | Source: Ambulatory Visit | Attending: Internal Medicine | Admitting: Internal Medicine

## 2014-06-04 ENCOUNTER — Ambulatory Visit (INDEPENDENT_AMBULATORY_CARE_PROVIDER_SITE_OTHER): Payer: 59 | Admitting: Internal Medicine

## 2014-06-04 ENCOUNTER — Encounter: Payer: Self-pay | Admitting: Internal Medicine

## 2014-06-04 VITALS — BP 110/70 | HR 76 | Temp 97.8°F | Wt 173.0 lb

## 2014-06-04 DIAGNOSIS — M546 Pain in thoracic spine: Secondary | ICD-10-CM

## 2014-06-04 DIAGNOSIS — N926 Irregular menstruation, unspecified: Secondary | ICD-10-CM

## 2014-06-04 DIAGNOSIS — R5383 Other fatigue: Secondary | ICD-10-CM

## 2014-06-04 NOTE — Assessment & Plan Note (Signed)
Goes back 6 months and getting worse Will check x-ray given past thyroid cancer (though many years ago) Will set up with Dr Lorelei Pont to review

## 2014-06-04 NOTE — Assessment & Plan Note (Signed)
May just be hormonal changes  Has LLQ tenderness which may indicate a cyst She prefers no internal today---if bleeding/pain persist, will need to (and consider ultrasound)

## 2014-06-04 NOTE — Progress Notes (Signed)
   Subjective:    Patient ID: Angel Hernandez, female    DOB: Jun 07, 1971, 43 y.o.   MRN: [id number]  HPI Started with normal period ~12 days ago, but ended 2 days early. Restarted 3 days later again and now still going 4 days later. Flow is normal for her No birth control (had tubes tied) Having some right lower abdominal pain (not her normal cramps)  Feels really tired Has pain in arms and legs---wonders if she is run down Did have good week off a month ago Also upper back pain "that is really bad"--goes back over 6 months Painful right foot plantar wart--- being treated by dermatologist in [place]  Regular partner for 6 months No condoms No vaginal discharge  Dizzy all day  Room spinning if she turned her head--today Walking okay Headache yesterday and today  Hasn't taken any pain meds today Ibuprofen hasn't helped  Current Outpatient Prescriptions on File Prior to Visit  Medication Sig Dispense Refill  . levothyroxine (SYNTHROID, LEVOTHROID) 112 MCG tablet TAKE ONE TABLET BY MOUTH EVERY DAY 30 tablet 5   No current facility-administered medications on file prior to visit.    No Known Allergies  Past Medical History  Diagnosis Date  . Thyroid cancer 2001  . Hypothyroid   . Tobacco abuse     Past Surgical History  Procedure Laterality Date  . Thyroidectomy  2001  . Cesarean section      No family history on file.  History   Social History  . Marital Status: Married    Spouse Name: N/A    Number of Children: N/A  . Years of Education: N/A   Occupational History  . Surveyor, [employer] in [place] History Main Topics  . Smoking status: Current Every Day Smoker -- 0.50 packs/day    Types: Cigarettes  . Smokeless tobacco: Never Used  . Alcohol Use: No  . Drug Use: No  . Sexual Activity:    Partners: Male   Other Topics Concern  . Not on file   Social History Narrative   4 children - 20, 79, 26, 21.   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 No recurrence. 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 No distress.  Neck: Normal range of motion. Neck supple. No thyromegaly present.  Cardiovascular: Normal rate, regular rhythm and normal heart sounds.   Pulmonary/Chest: Effort normal and breath sounds normal. No respiratory distress. She has no wheezes. She has no rales.  Abdominal: Soft. She exhibits no distension. There is no rebound and no guarding.  Mild LLQ tenderness  Musculoskeletal:  Pain is around T8-10 but not really tender over the spine. No distinct paraspinal tenderness  Antalgic gait due to plantar warts  Lymphadenopathy:    She has no cervical adenopathy.  Psychiatric:  Tearful at times discussing all her pain, etc Doesn't appear depressed though          Assessment & Plan:

## 2014-06-04 NOTE — Progress Notes (Signed)
Pre visit review using our clinic review tool, if applicable. No additional management support is needed unless otherwise documented below in the visit note. 

## 2014-06-04 NOTE — Assessment & Plan Note (Signed)
And some vertigo today Discussed meclizine Ears look fine Will check labs----seems stressed out with pain (mostly from the back and warts--and now with irregular menses)

## 2014-06-05 ENCOUNTER — Encounter: Payer: Self-pay | Admitting: Family Medicine

## 2014-06-05 ENCOUNTER — Encounter: Payer: Self-pay | Admitting: *Deleted

## 2014-06-05 LAB — COMPREHENSIVE METABOLIC PANEL
ALT: 22 U/L (ref 0–35)
AST: 20 U/L (ref 0–37)
Albumin: 3.8 g/dL (ref 3.5–5.2)
Alkaline Phosphatase: 53 U/L (ref 39–117)
BUN: 10 mg/dL (ref 6–23)
CO2: 26 mEq/L (ref 19–32)
Calcium: 9.4 mg/dL (ref 8.4–10.5)
Chloride: 107 mEq/L (ref 96–112)
Creatinine, Ser: 0.9 mg/dL (ref 0.4–1.2)
GFR: 77.22 mL/min (ref >60.00)
Glucose, Bld: 78 mg/dL (ref 70–99)
Potassium: 4.4 mEq/L (ref 3.5–5.1)
Sodium: 139 mEq/L (ref 135–145)
Total Bilirubin: 0.2 mg/dL (ref 0.2–1.2)
Total Protein: 7 g/dL (ref 6.0–8.3)

## 2014-06-05 LAB — CBC WITH DIFFERENTIAL/PLATELET
Basophils Absolute: 0.1 10*3/uL (ref 0.0–0.1)
Basophils Relative: 1.1 % (ref 0.0–3.0)
Eosinophils Absolute: 0.2 10*3/uL (ref 0.0–0.7)
Eosinophils Relative: 2.1 % (ref 0.0–5.0)
HCT: 41.9 % (ref 36.0–46.0)
Hemoglobin: 14.2 g/dL (ref 12.0–15.0)
Lymphocytes Relative: 27.7 % (ref 12.0–46.0)
Lymphs Abs: 2.2 10*3/uL (ref 0.7–4.0)
MCHC: 33.9 g/dL (ref 30.0–36.0)
MCV: 94.3 fl (ref 78.0–100.0)
Monocytes Absolute: 0.8 10*3/uL (ref 0.1–1.0)
Monocytes Relative: 9.7 % (ref 3.0–12.0)
Neutro Abs: 4.7 10*3/uL (ref 1.4–7.7)
Neutrophils Relative %: 59.4 % (ref 43.0–77.0)
Platelets: 269 10*3/uL (ref 150.0–400.0)
RBC: 4.44 Mil/uL (ref 3.87–5.11)
RDW: 13.3 % (ref 11.5–15.5)
WBC: 7.8 10*3/uL (ref 4.0–10.5)

## 2014-06-05 LAB — T4, FREE: Free T4: 1.15 ng/dL (ref 0.60–1.60)

## 2014-06-08 ENCOUNTER — Encounter: Payer: Self-pay | Admitting: *Deleted

## 2014-06-11 ENCOUNTER — Ambulatory Visit (INDEPENDENT_AMBULATORY_CARE_PROVIDER_SITE_OTHER): Payer: 59 | Admitting: Family Medicine

## 2014-06-11 ENCOUNTER — Encounter: Payer: Self-pay | Admitting: Family Medicine

## 2014-06-11 VITALS — BP 110/72 | HR 79 | Temp 98.1°F | Ht 67.0 in | Wt 173.0 lb

## 2014-06-11 DIAGNOSIS — M546 Pain in thoracic spine: Secondary | ICD-10-CM

## 2014-06-11 DIAGNOSIS — M545 Low back pain, unspecified: Secondary | ICD-10-CM

## 2014-06-11 MED ORDER — TIZANIDINE HCL 4 MG PO TABS: 4.0000 mg | ORAL_TABLET | Freq: Every evening | ORAL | Status: DC

## 2014-06-11 MED ORDER — DICLOFENAC SODIUM 75 MG PO TBEC: 75.0000 mg | DELAYED_RELEASE_TABLET | Freq: Two times a day (BID) | ORAL | Status: DC

## 2014-06-11 NOTE — Progress Notes (Signed)
Dr. Frederico Hamman T. Mckinzey Entwistle, MD, [facility] [place], [address] Phone: [phone] Fax: [phone]  06/11/2014  Patient: Angel Hernandez, MRN: [id number], DOB: [birth year], 43 y.o.  Primary Physician:  Owens Loffler, MD  Chief Complaint: Follow-up  Subjective:   Angel Hernandez is a 43 y.o. very pleasant female patient who presents with the following: Back Pain  ongoing for approximately: intermittently for years The patient has had back pain before. The back pain is localized into the lumbar spine area and some in the T-spine. They also describe no radiculopathy.  Numbness and tingling, sometimes in feet and sometimes in her arms.  Lately pain in her neck and will have some catching.  Neck rotational movements are hurting.   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   No numbness or tingling. No bowel or bladder incontinence. No focal weakness. Prior interventions: none Physical therapy: No Chiropractic manipulations: No Acupuncture: No Osteopathic manipulation: No Heat or cold: Minimal effect  Past Medical History, Surgical History, Family History, Medications, Allergies have been reviewed and updated if relevant.  GEN: No fevers, chills. Nontoxic. Primarily MSK c/o today. MSK: Detailed in the HPI GI: tolerating PO intake without difficulty Neuro: As above  Otherwise the pertinent positives of the ROS are noted above.    Objective:   Blood pressure 110/72, pulse 79, temperature 98.1 F (36.7 C), temperature source Oral, height 5\' 7"  (1.702 m), weight 173 lb (78.472 kg), last menstrual period 06/04/2014.  Gen: Well-developed,well-nourished,in no acute distress; alert,appropriate and cooperative throughout examination HEENT: Normocephalic and atraumatic without obvious abnormalities.  Ears, externally no deformities Pulm: Breathing comfortably in no  respiratory distress Range of motion at  the waist: Flexion, rotation and lateral bending: modest limitation in flexion and extension. Mild pain with lateral bending and rotation  No echymosis or edema Rises to examination table with no difficulty Gait: minimally antalgic  Inspection/Deformity: No abnormality Paraspinus T:  Mild ttp most of tspine and lspine  B Ankle Dorsiflexion (L5,4): 5/5 B Great Toe Dorsiflexion (L5,4): 5/5 Heel Walk (L5): WNL Toe Walk (S1): WNL Rise/Squat (L4): WNL, mild pain  SENSORY B Medial Foot (L4): WNL B Dorsum (L5): WNL B Lateral (S1): WNL Light Touch: WNL Pinprick: WNL  REFLEXES Knee (L4): 2+ Ankle (S1): 2+  B SLR, seated: neg B SLR, supine: neg B FABER: + B Reverse FABER: neg B Greater Troch: NT B Log Roll: neg B Stork: NT B Sciatic Notch: mild ttp  Radiology: Dg Thoracic Spine 2 View  06/05/2014   CLINICAL DATA:  Six-month history of pain ; history of thyroid carcinoma  EXAM: THORACIC SPINE - 3 VIEW  COMPARISON:  None.  FINDINGS: Frontal, lateral, and swimmer's views were obtained. There is slight anterior wedging of the T11 vertebral body with localized osteoarthritic change at the total evident T11-12. There is also mild disc space narrowing at several other levels in the lower thoracic region. No other evidence of fracture. No spondylolisthesis. No blastic or lytic bone lesions.  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 There is slight increased kyphosis in this region. No other fracture. No spondylolisthesis. No blastic or lytic bone lesions appreciable.   Electronically Signed   By: Lowella Grip M.D.   On: 06/05/2014 08:08    Assessment and Plan:   Bilateral low  back pain without sciatica  Bilateral thoracic back pain  Relatively diffuse pain in the thoracic and lumbar spine.  Nonfocal examination.  The patient is also significantly overweight, and I  think she likely has some significant core weakness that is contributing.  Encouraged more activity such as bicycling and getting into the pool.  A rehabilitation program from the [employer] was reviewed with the patient face to face for their condition.   Follow-up: Return in about 6 weeks (around 07/23/2014).  New Prescriptions   DICLOFENAC (VOLTAREN) 75 MG EC TABLET    Take 1 tablet (75 mg total) by mouth 2 (two) times daily.   TIZANIDINE (ZANAFLEX) 4 MG TABLET    Take 1 tablet (4 mg total) by mouth Nightly.   No orders of the defined types were placed in this encounter.    Signed,  Maud Deed. Marcianna Daily, MD   Patient's Medications  New Prescriptions   DICLOFENAC (VOLTAREN) 75 MG EC TABLET    Take 1 tablet (75 mg total) by mouth 2 (two) times daily.   TIZANIDINE (ZANAFLEX) 4 MG TABLET    Take 1 tablet (4 mg total) by mouth Nightly.  Previous Medications   LEVOTHYROXINE (SYNTHROID, LEVOTHROID) 112 MCG TABLET    TAKE ONE TABLET BY MOUTH EVERY DAY  Modified Medications   No medications on file  Discontinued Medications   CLINDAMYCIN (CLEOCIN) 300 MG CAPSULE       HYDROCODONE-ACETAMINOPHEN (VICODIN) 5-500 MG PER TABLET

## 2014-06-11 NOTE — Progress Notes (Signed)
Pre visit review using our clinic review tool, if applicable. No additional management support is needed unless otherwise documented below in the visit note. 

## 2014-07-27 ENCOUNTER — Encounter: Payer: Self-pay | Admitting: Family Medicine

## 2014-07-27 ENCOUNTER — Ambulatory Visit (INDEPENDENT_AMBULATORY_CARE_PROVIDER_SITE_OTHER): Payer: 59 | Admitting: Family Medicine

## 2014-07-27 VITALS — BP 118/78 | HR 72 | Temp 98.3°F | Wt 174.8 lb

## 2014-07-27 DIAGNOSIS — H6981 Other specified disorders of Eustachian tube, right ear: Secondary | ICD-10-CM

## 2014-07-27 DIAGNOSIS — M545 Low back pain, unspecified: Secondary | ICD-10-CM

## 2014-07-27 DIAGNOSIS — M546 Pain in thoracic spine: Secondary | ICD-10-CM

## 2014-07-27 NOTE — Progress Notes (Signed)
Pre visit review using our clinic review tool, if applicable. No additional management support is needed unless otherwise documented below in the visit note. 

## 2014-07-27 NOTE — Patient Instructions (Addendum)
Eustachian Tube Dysfunction: There is a tube that connects between the sinuses and behind the ear called the "eustachian tube." Sometimes when you have allergies, a cold, or nasal congestion for any reason this tube can get blocked and pressure cannot equalize in your ears. (Like if you swim in deep water) This can also trap fluid behind the ear and give you a full, pressure-like sensation that is uncomfortable, but it is not an ear infection.  Recommendations: Afrin Nasal Spray: 2 sprays twice a day for a maximum of 3-4 days (longer than this and your nose gets addicted, and you have rebound swelling that makes it worse.) Sudafed: Either pseudoephedrine or phenylephrine. As directed on box. (Not is heart problems or high blood pressure) Anti-Histamine: Allegra, Zyrtec, or Claritin. All over the counter now and once a day.  If you develop fever > 100.4, then things can change fluid behind the ear does increase your risk of developing an ear infection.

## 2014-07-27 NOTE — Progress Notes (Signed)
Dr. Frederico Hamman T. Memphis Creswell, MD, [facility] [place], [address] Phone: [phone] Fax: [phone]  07/27/2014  Patient: Angel Hernandez, MRN: [id number], DOB: Oct 22, 1970, 43 y.o.  Primary Physician:  Owens Loffler, MD  Chief Complaint: Follow-up  Subjective:   Angel Hernandez is a 43 y.o. very pleasant female patient who presents with the following: Back Pain  Doing much better now. Her pain is very manageable. She is not having a radicular symptoms. Medications are helping, and she also got a foam roller, which is helped her thoracic spine quite a bit. She is now doing this twice a day. She is trying to gently start working on some core routines.  06/11/2014 Last OV with Owens Loffler, MD  ongoing for approximately: intermittently for years The patient has had back pain before. The back pain is localized into the lumbar spine area and some in the T-spine. They also describe no radiculopathy.  Numbness and tingling, sometimes in feet and sometimes in her arms.  Lately pain in her neck and will have some catching.  Neck rotational movements are hurting.   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   No numbness or tingling. No bowel or bladder incontinence. No focal weakness. Prior interventions: none Physical therapy: No Chiropractic manipulations: No Acupuncture: No Osteopathic manipulation: No Heat or cold: Minimal effect  Past Medical History, Surgical History, Family History, Medications, Allergies have been reviewed and updated if relevant.  GEN: No fevers, chills. Nontoxic. Primarily MSK c/o today. MSK: Detailed in the HPI GI: tolerating PO intake without difficulty Neuro: As above  Otherwise the pertinent positives of the ROS are noted above.    Objective:   Blood pressure 118/78, pulse 72, temperature 98.3 F (36.8 C), temperature source Oral,  weight 174 lb 12 oz (79.266 kg), last menstrual period 07/22/2014.  Gen: Well-developed,well-nourished,in no acute distress; alert,appropriate and cooperative throughout examination HEENT: Normocephalic and atraumatic without obvious abnormalities.  Ears, externally no deformities. Fluid behind both ears, serous. Pulm: Breathing comfortably in no respiratory distress Range of motion at  the waist: Flexion, rotation and lateral bending: modest limitation in flexion and extension. Mild pain with lateral bending and rotation  No echymosis or edema Rises to examination table with no difficulty Gait: minimally antalgic  Inspection/Deformity: No abnormality Paraspinus T:  nt   Assessment and Plan:   Bilateral low back pain without sciatica  Bilateral thoracic back pain  Eustachian tube dysfunction, right  Reviewed long-term back maintenance.  Reviewed simple measures for eustachian tube dysfunction the  Follow-up: 1 year  Patient Instructions  Eustachian Tube Dysfunction: There is a tube that connects between the sinuses and behind the ear called the "eustachian tube." Sometimes when you have allergies, a cold, or nasal congestion for any reason this tube can get blocked and pressure cannot equalize in your ears. (Like if you swim in deep water) This can also trap fluid behind the ear and give you a full, pressure-like sensation that is uncomfortable, but it is not an ear infection.  Recommendations: Afrin Nasal Spray: 2 sprays twice a day for a maximum of 3-4 days (longer than this and your nose gets addicted, and you have rebound swelling that makes it worse.) Sudafed: Either pseudoephedrine or phenylephrine. As directed on box. (Not is heart problems or high blood pressure) Anti-Histamine: Allegra, Zyrtec, or Claritin. All over the counter  now and once a day.  If you develop fever > 100.4, then things can change fluid behind the ear does increase your risk of developing an ear  infection.      Signed,  Maud Deed. Lanier Millon, MD   Patient's Medications  New Prescriptions   No medications on file  Previous Medications   DICLOFENAC (VOLTAREN) 75 MG EC TABLET    Take 1 tablet (75 mg total) by mouth 2 (two) times daily.   LEVOTHYROXINE (SYNTHROID, LEVOTHROID) 112 MCG TABLET    TAKE ONE TABLET BY MOUTH EVERY DAY   TIZANIDINE (ZANAFLEX) 4 MG TABLET    Take 1 tablet (4 mg total) by mouth Nightly.  Modified Medications   No medications on file  Discontinued Medications   No medications on file

## 2014-09-23 ENCOUNTER — Other Ambulatory Visit: Payer: Self-pay | Admitting: Family Medicine

## 2014-10-26 ENCOUNTER — Other Ambulatory Visit: Payer: Self-pay | Admitting: Family Medicine

## 2014-10-26 NOTE — Telephone Encounter (Signed)
Last office visit 07/27/2014.  Last TSH 10/01/2013 with FT4 rechecked 06/04/2014 when she saw Dr. Silvio Pate.  Ok to refill?

## 2014-11-09 ENCOUNTER — Other Ambulatory Visit: Payer: Self-pay | Admitting: Family Medicine

## 2014-11-09 NOTE — Telephone Encounter (Signed)
Last office visit 07/27/2014.  Last refilled 06/11/2014 for #30 with 3 refills.  Ok to refill?

## 2015-04-25 ENCOUNTER — Other Ambulatory Visit: Payer: Self-pay | Admitting: Family Medicine

## 2015-04-25 NOTE — Telephone Encounter (Signed)
Last office visit 07/27/2014 for back pain.  Last TSH checked 10/01/2013.  Refill?

## 2015-04-26 NOTE — Telephone Encounter (Signed)
Please call and schedule CPE with fasting labs with Dr. Lorelei Pont.  Needs appointment to continue getting refills on her medications.

## 2015-04-26 NOTE — Telephone Encounter (Signed)
Ok to refill 30, 2 ref  F/u CPX with f/u thyroid labs

## 2015-04-27 ENCOUNTER — Telehealth: Payer: Self-pay | Admitting: *Deleted

## 2015-04-27 MED ORDER — VARENICLINE TARTRATE 0.5 MG X 11 & 1 MG X 42 PO MISC: ORAL | Status: DC

## 2015-04-27 MED ORDER — VARENICLINE TARTRATE 1 MG PO TABS: 1.0000 mg | ORAL_TABLET | Freq: Two times a day (BID) | ORAL | Status: DC

## 2015-04-27 NOTE — Telephone Encounter (Signed)
done

## 2015-04-27 NOTE — Telephone Encounter (Signed)
Patient left a voicemail wanting to know if you will send in a script for Chantix for her to [facility]? Last office visit 07/27/14/acute visit.

## 2015-04-27 NOTE — Telephone Encounter (Signed)
Angel Hernandez notified by telephone that prescriptions have been sent in to her pharmacy as requested.

## 2015-05-25 ENCOUNTER — Other Ambulatory Visit: Payer: Self-pay | Admitting: Family Medicine

## 2015-05-25 NOTE — Telephone Encounter (Signed)
Last office visit 07/27/2014.  Last refilled 11/09/2014 for #30 with 4 refills.  Ok to refill?

## 2015-07-28 ENCOUNTER — Telehealth: Payer: Self-pay | Admitting: Family Medicine

## 2015-07-28 NOTE — Telephone Encounter (Signed)
Please call and schedule CPE with fasting labs prior with Dr. Lorelei Pont.  Needs appointment to keep getting refills on her medications.

## 2015-07-29 ENCOUNTER — Encounter: Payer: Self-pay | Admitting: Family Medicine

## 2015-07-29 NOTE — Telephone Encounter (Signed)
Patient does not answer phone and cannot leave a message.  Mailed letter asking pt to call to schedule CPE / lt

## 2015-08-26 NOTE — Telephone Encounter (Signed)
Pt schedule appointments Labs 2/20 Cpx 2/22

## 2015-08-30 ENCOUNTER — Telehealth: Payer: Self-pay | Admitting: *Deleted

## 2015-08-30 MED ORDER — LEVOTHYROXINE SODIUM 112 MCG PO TABS: 112.0000 ug | ORAL_TABLET | Freq: Every day | ORAL | Status: DC

## 2015-08-30 NOTE — Telephone Encounter (Signed)
Patient called starting that she needs a refill on her thyroid medication to last her until her physical scheduled 09/22/15.

## 2015-08-30 NOTE — Telephone Encounter (Signed)
Advised patient by telephone that a refill be sent in to last her until her appointment. Advised patient to make sure that she keeps her lab and office appointment in order to continue to get refills.

## 2015-09-20 ENCOUNTER — Other Ambulatory Visit (INDEPENDENT_AMBULATORY_CARE_PROVIDER_SITE_OTHER): Payer: BLUE CROSS/BLUE SHIELD

## 2015-09-20 ENCOUNTER — Other Ambulatory Visit: Payer: Self-pay

## 2015-09-20 DIAGNOSIS — Z13220 Encounter for screening for lipoid disorders: Secondary | ICD-10-CM

## 2015-09-20 DIAGNOSIS — R5383 Other fatigue: Secondary | ICD-10-CM

## 2015-09-20 DIAGNOSIS — E039 Hypothyroidism, unspecified: Secondary | ICD-10-CM | POA: Diagnosis not present

## 2015-09-20 LAB — LIPID PANEL
Cholesterol: 223 mg/dL — ABNORMAL HIGH (ref 0–200)
HDL: 41.9 mg/dL (ref >39.00)
LDL Cholesterol: 154 mg/dL — ABNORMAL HIGH (ref 0–99)
NonHDL: 181.56
Total CHOL/HDL Ratio: 5
Triglycerides: 136 mg/dL (ref 0.0–149.0)
VLDL: 27.2 mg/dL (ref 0.0–40.0)

## 2015-09-20 LAB — CBC WITH DIFFERENTIAL/PLATELET
Basophils Absolute: 0.1 10*3/uL (ref 0.0–0.1)
Basophils Relative: 0.9 % (ref 0.0–3.0)
Eosinophils Absolute: 0.1 10*3/uL (ref 0.0–0.7)
Eosinophils Relative: 1.1 % (ref 0.0–5.0)
HCT: 43.3 % (ref 36.0–46.0)
Hemoglobin: 14.6 g/dL (ref 12.0–15.0)
Lymphocytes Relative: 17.6 % (ref 12.0–46.0)
Lymphs Abs: 1.8 10*3/uL (ref 0.7–4.0)
MCHC: 33.6 g/dL (ref 30.0–36.0)
MCV: 93.6 fl (ref 78.0–100.0)
Monocytes Absolute: 0.7 10*3/uL (ref 0.1–1.0)
Monocytes Relative: 6.8 % (ref 3.0–12.0)
Neutro Abs: 7.5 10*3/uL (ref 1.4–7.7)
Neutrophils Relative %: 73.6 % (ref 43.0–77.0)
Platelets: 343 10*3/uL (ref 150.0–400.0)
RBC: 4.62 Mil/uL (ref 3.87–5.11)
RDW: 13.1 % (ref 11.5–15.5)
WBC: 10.2 10*3/uL (ref 4.0–10.5)

## 2015-09-20 LAB — BASIC METABOLIC PANEL
BUN: 9 mg/dL (ref 6–23)
CO2: 27 mEq/L (ref 19–32)
Calcium: 9.7 mg/dL (ref 8.4–10.5)
Chloride: 104 mEq/L (ref 96–112)
Creatinine, Ser: 0.74 mg/dL (ref 0.40–1.20)
GFR: 91.27 mL/min (ref >60.00)
Glucose, Bld: 87 mg/dL (ref 70–99)
Potassium: 4.5 mEq/L (ref 3.5–5.1)
Sodium: 138 mEq/L (ref 135–145)

## 2015-09-20 LAB — TSH: TSH: 0.54 u[IU]/mL (ref 0.35–4.50)

## 2015-09-20 LAB — HEPATIC FUNCTION PANEL
ALT: 14 U/L (ref 0–35)
AST: 19 U/L (ref 0–37)
Albumin: 4.5 g/dL (ref 3.5–5.2)
Alkaline Phosphatase: 64 U/L (ref 39–117)
Bilirubin, Direct: 0.1 mg/dL (ref 0.0–0.3)
Total Bilirubin: 0.5 mg/dL (ref 0.2–1.2)
Total Protein: 6.8 g/dL (ref 6.0–8.3)

## 2015-09-20 LAB — T3, FREE: T3, Free: 3 pg/mL (ref 2.3–4.2)

## 2015-09-20 LAB — T4, FREE: Free T4: 1.12 ng/dL (ref 0.60–1.60)

## 2015-09-22 ENCOUNTER — Other Ambulatory Visit: Payer: Self-pay | Admitting: *Deleted

## 2015-09-22 ENCOUNTER — Encounter: Payer: Self-pay | Admitting: Family Medicine

## 2015-09-22 ENCOUNTER — Ambulatory Visit (INDEPENDENT_AMBULATORY_CARE_PROVIDER_SITE_OTHER): Payer: BLUE CROSS/BLUE SHIELD | Admitting: Family Medicine

## 2015-09-22 VITALS — BP 120/80 | HR 82 | Temp 98.5°F | Ht 66.5 in | Wt 193.0 lb

## 2015-09-22 DIAGNOSIS — Z0000 Encounter for general adult medical examination without abnormal findings: Secondary | ICD-10-CM | POA: Diagnosis not present

## 2015-09-22 DIAGNOSIS — Z23 Encounter for immunization: Secondary | ICD-10-CM | POA: Diagnosis not present

## 2015-09-22 NOTE — Progress Notes (Signed)
Dr. Frederico Hamman T. Moe Graca, MD, [facility] Medicine [place], [address] Phone: [phone] Fax: [phone]  09/22/2015  Patient: Angel Hernandez, MRN: [id number], DOB: Feb 24, 1971, 45 y.o.  Primary Physician:  Owens Loffler, MD   Chief Complaint  Patient presents with  . Annual Exam   Subjective:   Angel Hernandez is a 45 y.o. pleasant patient who presents with the following:  Health Maintenance Summary Reviewed and updated, unless pt declines services.  Tobacco History Reviewed. 1 PPD Alcohol: No concerns, no excessive use Exercise Habits: Some activity, rec at least 30 mins 5 times a week STD concerns: none Drug Use: None Lumps or breast concerns: no Breast Cancer Family History: no  20 pound weight gain  Health Maintenance  Topic Date Due  . HIV Screening  03/31/1988  . INFLUENZA VACCINE  10/29/2015 (Originally 03/01/2015)  . PAP SMEAR  10/24/2015  . TETANUS/TDAP  09/21/2025    Immunization History  Administered Date(s) Administered  . Tdap 09/22/2015   Patient Active Problem List   Diagnosis Date Noted  . Irregular menses 06/04/2014  . Thoracic back pain 06/04/2014  . Fatigue 06/04/2014  . 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 Hypothyroid   . Tobacco abuse   . Thyroid cancer [facility])    Past Medical History  Diagnosis Date  . Thyroid cancer ([place]) 2001  . Hypothyroid   . Tobacco abuse    Past Surgical History  Procedure Laterality Date  . Thyroidectomy  2001  . Cesarean section     Social History   Social History  . Marital Status: Married    Spouse Name: N/A  . Number of Children: N/A  . Years of Education: N/A   Occupational History  . Surveyor, [employer] in [place] History Main Topics  . Smoking status: Current Every Day Smoker -- 0.50 packs/day    Types: Cigarettes  . Smokeless tobacco: Never Used  . Alcohol Use: No  . Drug Use: No  . Sexual Activity:    Partners:  Male   Other Topics Concern  . Not on file   Social History Narrative   4 children - 20, 27, 73, 32.   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 No Known Allergies  Medication list has been reviewed and updated.   General: Denies fever, chills, sweats. No significant weight loss. Eyes: Denies blurring,significant itching ENT: Denies earache, sore throat, and hoarseness.  Cardiovascular: Denies chest pains, palpitations, dyspnea on exertion,  Respiratory: Denies cough, dyspnea at rest,wheeezing Breast: no concerns about lumps GI: Denies nausea, vomiting, diarrhea, constipation, change in bowel habits, abdominal pain, melena, hematochezia GU: Denies dysuria, hematuria, urinary hesitancy, nocturia, denies STD risk, no concerns about discharge Musculoskeletal: Denies back pain, joint pain Derm: Denies rash, itching Neuro: Denies  paresthesias, frequent falls, frequent headaches Psych: Denies depression, anxiety Endocrine: Denies cold intolerance, heat intolerance, polydipsia Heme: Denies enlarged lymph nodes Allergy: No hayfever  Objective:   BP 120/80 mmHg  Pulse 82  Temp(Src) 98.5 F (36.9 C) (Oral)  Ht 5' 6.5" (1.689 m)  Wt 193 lb (87.544 kg)  BMI 30.69 kg/m2  LMP 09/09/2015 No exam data present  GEN: well developed, well nourished, no acute distress Eyes: conjunctiva and lids normal, PERRLA, EOMI ENT: TM clear, nares clear, oral exam WNL Neck: supple, no lymphadenopathy,  no thyromegaly, no JVD Pulm: clear to auscultation and percussion, respiratory effort normal CV: regular rate and rhythm, S1-S2, no murmur, rub or gallop, no bruits Chest: no scars, masses, no lumps BREAST: breast exam normal. Chaperoned examination. Entirety of breast examined including axilla, and no enlarged lymph nodes. No significant masses are felt. No nipple discharge. No skin changes. Overall, reassuring breast exam.    This portion of the physical examination was chaperoned by Hedy Camara, CMA.  GI: soft, non-tender; no hepatosplenomegaly, masses; active bowel sounds all quadrants GU: GU exam declined Lymph: no cervical, axillary or inguinal adenopathy MSK: gait normal, muscle tone and strength WNL, no joint swelling, effusions, discoloration, crepitus  SKIN: clear, good turgor, color WNL, no rashes, lesions, or ulcerations Neuro: normal mental status, normal strength, sensation, and motion Psych: alert; oriented to person, place and time, normally interactive and not anxious or depressed in appearance.   All labs reviewed with patient. Lipids:    Component Value Date/Time   CHOL 223* 09/20/2015 1125   TRIG 136.0 09/20/2015 1125   HDL 41.90 09/20/2015 1125   VLDL 27.2 09/20/2015 1125   CHOLHDL 5 09/20/2015 1125   CBC: CBC Latest Ref Rng 09/20/2015 06/04/2014 11/03/2013  WBC 4.0 - 10.5 K/uL 10.2 7.8 9.5  Hemoglobin 12.0 - 15.0 g/dL 14.6 14.2 14.0  Hematocrit 36.0 - 46.0 % 43.3 41.9 42.1  Platelets 150.0 - 400.0 K/uL 343.0 269.0 027    Basic Metabolic Panel:    Component Value Date/Time   NA 138 09/20/2015 1125   K 4.5 09/20/2015 1125   CL 104 09/20/2015 1125   CO2 27 09/20/2015 1125   BUN 9 09/20/2015 1125   CREATININE 0.74 09/20/2015 1125   GLUCOSE 87 09/20/2015 1125   CALCIUM 9.7 09/20/2015 1125   Hepatic Function Latest Ref Rng 09/20/2015 06/04/2014 11/07/2012  Total Protein 6.0 - 8.3 g/dL 6.8 7.0 7.1  Albumin 3.5 - 5.2 g/dL 4.5 3.8 4.1  AST 0 - 37 U/L _0 ALT 0 - 35 U/L _1 Alk Phosphatase 39 - 117 U/L 64 53 58  Total Bilirubin 0.2 - 1.2 mg/dL 0.5 0.2 0.5  Bilirubin, Direct 0.0 - 0.3 mg/dL 0.1 - 0.1    Lab Results  Component Value Date   TSH 0.54 09/20/2015   No results found.  Assessment and Plan:   Healthcare maintenance  Need for prophylactic vaccination with combined diphtheria-tetanus-pertussis (DTP) vaccine - Plan: Tdap vaccine greater than or equal to  7yo IM  Health Maintenance Exam: The patient's preventative maintenance and recommended screening tests for an annual wellness exam were reviewed in full today. Brought up to date unless services declined.  Counselled on the importance of diet, exercise, and its role in overall health and mortality. The patient's FH and SH was reviewed, including their home life, tobacco status, and drug and alcohol status.  Follow-up: No Follow-up on file. Or follow-up in 1 year for complete physical examination  New Prescriptions   No medications on file   Modified Medications   No medications on file   Orders Placed This Encounter  Procedures  . Tdap vaccine greater than or equal to 7yo IM    Signed,  Hosie Sharman T. Christopher Glasscock, MD   Patient's Medications  New Prescriptions   No medications on file  Previous Medications   LEVOTHYROXINE (SYNTHROID, LEVOTHROID) 112 MCG TABLET    Take 1 tablet (112 mcg total) by mouth daily.   TIZANIDINE (ZANAFLEX) 4 MG TABLET  TAKE 1 TABLET BY MOUTH EVERY EVENING.  Modified Medications   No medications on file  Discontinued Medications   CLINDAMYCIN (CLEOCIN) 300 MG CAPSULE       DICLOFENAC (VOLTAREN) 75 MG EC TABLET    Take 1 tablet (75 mg total) by mouth 2 (two) times daily.   HYDROCODONE-ACETAMINOPHEN (VICODIN) 5-500 MG TABLET       VARENICLINE (CHANTIX STARTING MONTH PAK) 0.5 MG X 11 & 1 MG X 42 TABLET    Take one 0.5 mg tab po Q day for 3 days, then increase to one 0.76m tab BID for 3 days, then one 162mtablet twice daily.   VARENICLINE (CHANTIX) 1 MG TABLET    Take 1 tablet (1 mg total) by mouth 2 (two) times daily.

## 2015-09-22 NOTE — Progress Notes (Signed)
Pre visit review using our clinic review tool, if applicable. No additional management support is needed unless otherwise documented below in the visit note. 

## 2015-09-28 ENCOUNTER — Other Ambulatory Visit: Payer: Self-pay | Admitting: Family Medicine

## 2016-09-24 ENCOUNTER — Other Ambulatory Visit: Payer: Self-pay | Admitting: Family Medicine

## 2016-09-24 NOTE — Telephone Encounter (Signed)
Ok to refill 3 month supply, f/u physical in the next few months

## 2016-09-26 DIAGNOSIS — N939 Abnormal uterine and vaginal bleeding, unspecified: Secondary | ICD-10-CM | POA: Diagnosis not present

## 2016-09-26 DIAGNOSIS — Z01419 Encounter for gynecological examination (general) (routine) without abnormal findings: Secondary | ICD-10-CM | POA: Diagnosis not present

## 2016-09-26 DIAGNOSIS — Z8639 Personal history of other endocrine, nutritional and metabolic disease: Secondary | ICD-10-CM | POA: Diagnosis not present

## 2016-10-11 DIAGNOSIS — N939 Abnormal uterine and vaginal bleeding, unspecified: Secondary | ICD-10-CM | POA: Diagnosis not present

## 2016-10-17 ENCOUNTER — Other Ambulatory Visit: Payer: Self-pay

## 2016-10-17 MED ORDER — LEVOTHYROXINE SODIUM 112 MCG PO TABS: 112.0000 ug | ORAL_TABLET | Freq: Every day | ORAL | 0 refills | Status: DC

## 2016-10-17 NOTE — Telephone Encounter (Signed)
Pt left v/m that she has appt with Endo on 10/31/16 but does not have enough med to last until then. Last annual 09/22/15. Refilled per protocol and pt voiced understanding.

## 2016-10-31 DIAGNOSIS — E890 Postprocedural hypothyroidism: Secondary | ICD-10-CM | POA: Diagnosis not present

## 2016-10-31 DIAGNOSIS — Z85850 Personal history of malignant neoplasm of thyroid: Secondary | ICD-10-CM | POA: Diagnosis not present

## 2016-11-09 DIAGNOSIS — Z85850 Personal history of malignant neoplasm of thyroid: Secondary | ICD-10-CM | POA: Diagnosis not present

## 2016-11-09 DIAGNOSIS — E890 Postprocedural hypothyroidism: Secondary | ICD-10-CM | POA: Diagnosis not present

## 2016-11-21 ENCOUNTER — Other Ambulatory Visit: Payer: Self-pay | Admitting: Family Medicine

## 2016-11-21 ENCOUNTER — Encounter: Payer: Self-pay | Admitting: Internal Medicine

## 2016-11-21 ENCOUNTER — Ambulatory Visit (INDEPENDENT_AMBULATORY_CARE_PROVIDER_SITE_OTHER): Payer: BLUE CROSS/BLUE SHIELD | Admitting: Internal Medicine

## 2016-11-21 VITALS — BP 124/82 | HR 80 | Temp 98.2°F | Wt 209.0 lb

## 2016-11-21 DIAGNOSIS — J0100 Acute maxillary sinusitis, unspecified: Secondary | ICD-10-CM | POA: Diagnosis not present

## 2016-11-21 DIAGNOSIS — G43109 Migraine with aura, not intractable, without status migrainosus: Secondary | ICD-10-CM | POA: Diagnosis not present

## 2016-11-21 MED ORDER — AMOXICILLIN-POT CLAVULANATE 875-125 MG PO TABS: 1.0000 | ORAL_TABLET | Freq: Two times a day (BID) | ORAL | 0 refills | Status: DC

## 2016-11-21 NOTE — Patient Instructions (Signed)

## 2016-11-21 NOTE — Progress Notes (Signed)
HPI  Pt presents to the clinic today with c/o headache, facial pain and pressure, nasal congestion and sore throat. She reports this started 10 days ago. She is blowing green mucous out of her nose. She denies difficulty swallowing. She denies fevers, but has had chills and body aches. She also reports visual changes, but reports this has been intermittent for the last few years. She says she will see prisms and she is unable to see through the prisms. She never experiences headaches with these visual changes. She has been evaluated by opthalmology but reports she did not mention this. She has tried nasal saline with minimal relief. She has a history of seasonal allergies, but never this bad. She has not had sick contacts that she is aware of.  Review of Systems     Past Medical History:  Diagnosis Date  . Hypothyroid   . Thyroid cancer ([place]) 2001  . Tobacco abuse     No family history on file.  Social History   Social History  . Marital status: Married    Spouse name: N/A  . Number of children: N/A  . Years of education: N/A   Occupational History  . 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 Smoking status: Current Every Day Smoker    Packs/day: 0.50    Types: Cigarettes  . Smokeless tobacco: Never Used  . Alcohol use No  . Drug use: No  . Sexual activity: Yes    Partners: Male   Other Topics Concern  . Not on file   Social History Narrative   4 children - 20, 80, 18, 41.   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 Denies fever or abrupt weight changes.  HEENT:  Positive facial pain, nasal congestion and sore throat. Denies eye redness, ear pain, ringing in the ears, wax buildup, runny nose or bloody nose. Respiratory: Denies cough, difficulty breathing or shortness of breath.  Cardiovascular: Denies chest pain,  chest tightness, palpitations or swelling in the hands or feet.   No other specific complaints in a complete review of systems (except as listed in HPI above).  Objective:   Wt 209 lb (94.8 kg)   BMI 33.23 kg/m  BP 124/82   Pulse 80   Temp 98.2 F (36.8 C)   Wt 209 lb (94.8 kg)   SpO2 98%   BMI 33.23 kg/m   General: Appears her stated age, ill appearing in NAD. HEENT: Head: normal shape and size, maxillary sinus tenderness noted;  Ears: Tm's gray and intact, normal light reflex, + serous effusion bilaterally; Nose: mucosa boggy and moist, turbinates swollen but septum midline; Throat/Mouth: + PND. Teeth present, mucosa pink and moist, no exudate noted, no lesions or ulcerations noted.  Neck:  No adenopathy noted.  Cardiovascular: Normal rate and rhythm. S1,S2 noted.  No murmur, rubs or gallops noted.  Pulmonary/Chest: Normal effort and positive vesicular breath sounds. No respiratory distress. No wheezes, rales or ronchi noted.       Assessment & Plan:   Acute bacterial sinusitis  Can use a Neti Pot which can be purchased from your local drug store. Flonase 2 sprays each nostril for 3 days and then as needed. eRx for Augmentin BID for 10 days  Possible Ocular Migraines:  Follow up with optho or PCP to  discuss this further  RTC as needed or if symptoms persist. Webb Silversmith, NP

## 2017-02-21 DIAGNOSIS — N859 Noninflammatory disorder of uterus, unspecified: Secondary | ICD-10-CM | POA: Diagnosis not present

## 2017-02-21 DIAGNOSIS — N939 Abnormal uterine and vaginal bleeding, unspecified: Secondary | ICD-10-CM | POA: Diagnosis not present

## 2017-03-13 ENCOUNTER — Encounter
Admission: RE | Admit: 2017-03-13 | Discharge: 2017-03-13 | Disposition: A | Discharge status: Home / Self Care | Payer: BLUE CROSS/BLUE SHIELD | Source: Ambulatory Visit | Attending: Obstetrics & Gynecology | Admitting: Obstetrics & Gynecology

## 2017-03-13 DIAGNOSIS — Z01812 Encounter for preprocedural laboratory examination: Secondary | ICD-10-CM | POA: Insufficient documentation

## 2017-03-13 DIAGNOSIS — N939 Abnormal uterine and vaginal bleeding, unspecified: Secondary | ICD-10-CM | POA: Diagnosis not present

## 2017-03-13 HISTORY — DX: Headache, unspecified: R51.9

## 2017-03-13 HISTORY — DX: Headache: R51

## 2017-03-13 HISTORY — DX: Gastro-esophageal reflux disease without esophagitis: K21.9

## 2017-03-13 LAB — DIFFERENTIAL
Basophils Absolute: 0.1 10*3/uL (ref 0–0.1)
Basophils Relative: 1 %
Eosinophils Absolute: 0.1 10*3/uL (ref 0–0.7)
Eosinophils Relative: 2 %
Lymphocytes Relative: 22 %
Lymphs Abs: 2.1 10*3/uL (ref 1.0–3.6)
Monocytes Absolute: 0.8 10*3/uL (ref 0.2–0.9)
Monocytes Relative: 8 %
Neutro Abs: 6.2 10*3/uL (ref 1.4–6.5)
Neutrophils Relative %: 67 %

## 2017-03-13 LAB — BASIC METABOLIC PANEL
Anion gap: 7 (ref 5–15)
BUN: 12 mg/dL (ref 6–20)
CO2: 25 mmol/L (ref 22–32)
Calcium: 9.1 mg/dL (ref 8.9–10.3)
Chloride: 107 mmol/L (ref 101–111)
Creatinine, Ser: 0.77 mg/dL (ref 0.44–1.00)
GFR calc Af Amer: >60 mL/min (ref >60)
GFR calc non Af Amer: >60 mL/min (ref >60)
Glucose, Bld: 85 mg/dL (ref 65–99)
Potassium: 4.2 mmol/L (ref 3.5–5.1)
Sodium: 139 mmol/L (ref 135–145)

## 2017-03-13 LAB — CBC
HCT: 42 % (ref 35.0–47.0)
Hemoglobin: 14.5 g/dL (ref 12.0–16.0)
MCH: 31.4 pg (ref 26.0–34.0)
MCHC: 34.4 g/dL (ref 32.0–36.0)
MCV: 91.2 fL (ref 80.0–100.0)
Platelets: 309 10*3/uL (ref 150–440)
RBC: 4.61 MIL/uL (ref 3.80–5.20)
RDW: 13.7 % (ref 11.5–14.5)
WBC: 9.3 10*3/uL (ref 3.6–11.0)

## 2017-03-13 LAB — TYPE AND SCREEN
ABO/RH(D): A NEG
Antibody Screen: NEGATIVE

## 2017-03-13 NOTE — H&P (Signed)
Patient ID: Angel Hernandez is a 46 y.o. female presenting with Pre Op Consulting  on 03/13/2017  HPI: Was seen in March with terrible bleeding and long periods. H/o thryoid disease, TSH and panel normal. She is tired of bleeding, done with child bearing and would like definitive treatment.    Workup: tried several modalities of hormonal manipulation all without success due to either no improvement of bleeding or intolerable side effects.    Pap: neg/neg EMBx: no hyperplasia or carcinoma 01/2017 TVUS:  Uterus 9x5x5.5cm EE 50mm RO not see LO WNL  She is interested in hysterectomy.  Past Medical History:  has a past medical history of Fatigue; Hypothyroid, unspecified; Irregular menses, unspecified; Recurrent cold sores; Thoracic back pain; Thyroid cancer (CMS-HCC); and Tobacco abuse.  Past Surgical History:  has a past surgical history that includes Thyroidectomy Total (2001) and Cesarean section (2005). Family History: family history includes Dementia in her father; Diabetes in her father; Heart disease in her brother and mother; Kidney cancer in her father; Lung cancer in her father; No Known Problems in her brother, maternal grandfather, maternal grandmother, paternal grandfather, son, son, son, and son; Other in her mother; Pacemaker in her mother; Thyroid disease in her paternal grandmother. Social History:  reports that she has been smoking Cigarettes.  She has been smoking about 0.50 packs per day. She has never used smokeless tobacco. She reports that she drinks alcohol. She reports that she does not use drugs. OB/GYN History:          OB History    Gravida Para Term Preterm AB Living   6 4 3 1 2 4    SAB TAB Ectopic Molar Multiple Live Births   2         4      Allergies: has No Known Allergies. Medications:  Current Outpatient Prescriptions:  .  ibuprofen (ADVIL,MOTRIN) 200 MG tablet, Take 200 mg by mouth every 6 (six) hours as needed for Pain., Disp: , Rfl:  .   levothyroxine (SYNTHROID, LEVOTHROID) 112 MCG tablet, Take on an empty stomach 30-60 minutes before breakfast., Disp: 30 tablet, Rfl: 6 .  medroxyPROGESTERone (PROVERA) 10 MG tablet, Take 1 tablet (10 mg total) by mouth 2 (two) times daily., Disp: 60 tablet, Rfl: 1   Review of Systems: No SOB, no palpitations or chest pain, no new lower extremity edema, no nausea or vomiting or bowel or bladder complaints. See HPI for gyn specific ROS.   Exam:   BP 134/76   Pulse 78   Ht 168.9 cm (5' 6.5")   Wt 92.4 kg (203 lb 12.8 oz)   BMI 32.40 kg/m   General: Patient is well-groomed, well-nourished, appears stated age in no acute distress  HEENT: head is atraumatic and normocephalic, trachea is midline, neck is supple with no palpable nodules  CV: Regular rhythm and normal heart rate, no murmur  Pulm: Clear to auscultation throughout lung fields with no wheezing, crackles, or rhonchi. No increased work of breathing  Abdomen: soft , no mass, non-tender, no rebound tenderness, no hepatomegaly  Pelvic: tanner stage 5 ,              External genitalia: vulva /labia no lesions             Urethra: no prolapse             Vagina: normal physiologic d/c, laxity in vaginal walls             Cervix: no  lesions, no cervical motion tenderness, good descent             Uterus: normal size shape and contour, non-tender             Adnexa: no mass,  non-tender               Rectovaginal: External wnl   Impression:   The encounter diagnosis was Abnormal uterine bleeding (AUB), unspecified.    Plan:   The patient and I discussed the technical aspects of the procedure including the potential for risks and complications.These include but are not limited to the risk of infection requiring post-operative antibiotics or further procedures.We talked about the risk of injury to adjacent organs including bladder, bowel, ureter, blood vessels or nerves, the need to convert to an open  incision, possibleneed for blood transfusion andpostop complications such asthromboembolic or cardiopulmonary complications.All of her questions were answered. Her preoperative exam was completed andthe appropriate consents were signed. She is scheduled to undergo this procedure in the near future.  Planned surgery:  Total vaginal hysterectomy, bilateral salpingectomy Planned date: 03/19/17  Patient to go to pre-admit testing today  ----- Larey Days, MD Attending Obstetrician and Gynecologist [facility], Department of [facility]

## 2017-03-13 NOTE — Patient Instructions (Signed)
  Your procedure is scheduled on: March 19, 2017 [facility] ) Report to Same Day Surgery 2nd floor medical mall ([place] Entrance-take elevator on left to 2nd floor.  Check in with surgery information desk.) To find out your arrival time please call [phone] between 1PM - 3PM on March 16, 2017 (FRIDAY )  Remember: Instructions that are not followed completely may result in serious medical risk, up to and including death, or upon the discretion of your surgeon and anesthesiologist your surgery may need to be rescheduled.    _x___ 1. Do not eat food or drink liquids after midnight. No gum chewing or hard candies                        __x__ 2. No Alcohol for 24 hours before or after surgery.   __x__3. No Smoking for 24 prior to surgery.   ____  4. Bring all medications with you on the day of surgery if instructed.    __x__ 5. Notify your doctor if there is any change in your medical condition     (cold, fever, infections).     Do not wear jewelry, make-up, hairpins, clips or nail polish.  Do not wear lotions, powders, or perfumes.  Do not shave 48 hours prior to surgery. Men may shave face and neck.  Do not bring valuables to the hospital.    [facility] is not responsible for any belongings or valuables.               Contacts, dentures or bridgework may not be worn into surgery.  Leave your suitcase in the car. After surgery it may be brought to your room.  For patients admitted to the hospital, discharge time is determined by your  treatment team                        Patients discharged the day of surgery will not be allowed to drive home.  You will need someone to drive you home and stay with you the night of your procedure.    Please read over the following fact sheets that you were given:   [facility] Preparing for Surgery and or MRSA Information   TAKE THE FOLLOWING MEDICATION WITH A SIP OF WATER THE MORNING OF SURGERY  1.  LEVOTHYROXINE  2.  3.  4.  5.  6.  ____Fleets enema or Magnesium Citrate as directed.   _x___ Use CHG Soap or sage wipes as directed on instruction sheet   ____ Use inhalers on the day of surgery and bring to hospital day of surgery  ____ Stop Metformin and Janumet 2 days prior to surgery.    ____ Take 1/2 of usual insulin dose the night before surgery and none on the morning     surgery.   _x___ Follow recommendations from Cardiologist, Pulmonologist or PCP regarding          stopping Aspirin, Coumadin, Plavix ,Eliquis, Effient, or Pradaxa, and Pletal.  X____Stop Anti-inflammatories such as Advil, Aleve, Ibuprofen, Motrin, Naproxen, Naprosyn, Goodies powders or aspirin products. OK to take Tylenol    _x___ Stop supplements until after surgery.  But may continue Vitamin D, Vitamin B,       and multivitamin.   ____ Bring C-Pap to the hospital.

## 2017-03-18 MED ORDER — CEFAZOLIN SODIUM-DEXTROSE 2-4 GM/100ML-% IV SOLN
2.0000 g | Freq: Once | INTRAVENOUS | Status: AC
  Administered 2017-03-19: 2 g via INTRAVENOUS

## 2017-03-19 ENCOUNTER — Observation Stay
Admission: RE | Admit: 2017-03-19 | Discharge: 2017-03-20 | Disposition: A | Discharge status: Home / Self Care | Payer: BLUE CROSS/BLUE SHIELD | Source: Ambulatory Visit | Attending: Obstetrics & Gynecology | Admitting: Obstetrics & Gynecology

## 2017-03-19 ENCOUNTER — Encounter: Payer: Self-pay | Admitting: *Deleted

## 2017-03-19 ENCOUNTER — Ambulatory Visit: Payer: BLUE CROSS/BLUE SHIELD | Admitting: Anesthesiology

## 2017-03-19 ENCOUNTER — Encounter
Admission: RE | Disposition: A | Discharge status: Home / Self Care | Payer: Self-pay | Source: Ambulatory Visit | Attending: Obstetrics & Gynecology

## 2017-03-19 DIAGNOSIS — N800 Endometriosis of uterus: Secondary | ICD-10-CM | POA: Insufficient documentation

## 2017-03-19 DIAGNOSIS — N838 Other noninflammatory disorders of ovary, fallopian tube and broad ligament: Secondary | ICD-10-CM | POA: Diagnosis not present

## 2017-03-19 DIAGNOSIS — Z85850 Personal history of malignant neoplasm of thyroid: Secondary | ICD-10-CM | POA: Insufficient documentation

## 2017-03-19 DIAGNOSIS — D251 Intramural leiomyoma of uterus: Secondary | ICD-10-CM | POA: Diagnosis not present

## 2017-03-19 DIAGNOSIS — Z90710 Acquired absence of both cervix and uterus: Secondary | ICD-10-CM | POA: Diagnosis present

## 2017-03-19 DIAGNOSIS — N888 Other specified noninflammatory disorders of cervix uteri: Secondary | ICD-10-CM | POA: Diagnosis not present

## 2017-03-19 DIAGNOSIS — F17210 Nicotine dependence, cigarettes, uncomplicated: Secondary | ICD-10-CM | POA: Diagnosis not present

## 2017-03-19 DIAGNOSIS — N72 Inflammatory disease of cervix uteri: Secondary | ICD-10-CM | POA: Insufficient documentation

## 2017-03-19 DIAGNOSIS — E039 Hypothyroidism, unspecified: Secondary | ICD-10-CM | POA: Diagnosis not present

## 2017-03-19 DIAGNOSIS — K219 Gastro-esophageal reflux disease without esophagitis: Secondary | ICD-10-CM | POA: Insufficient documentation

## 2017-03-19 DIAGNOSIS — N938 Other specified abnormal uterine and vaginal bleeding: Secondary | ICD-10-CM | POA: Insufficient documentation

## 2017-03-19 DIAGNOSIS — N939 Abnormal uterine and vaginal bleeding, unspecified: Secondary | ICD-10-CM | POA: Diagnosis not present

## 2017-03-19 DIAGNOSIS — S3729XA Other injury of bladder, initial encounter: Secondary | ICD-10-CM | POA: Diagnosis not present

## 2017-03-19 HISTORY — PX: BILATERAL SALPINGECTOMY: SHX5743

## 2017-03-19 HISTORY — PX: VAGINAL HYSTERECTOMY: SHX2639

## 2017-03-19 HISTORY — PX: CYSTOSCOPY: SHX5120

## 2017-03-19 LAB — POCT PREGNANCY, URINE: Preg Test, Ur: NEGATIVE

## 2017-03-19 LAB — ABO/RH: ABO/RH(D): A NEG

## 2017-03-19 SURGERY — HYSTERECTOMY, VAGINAL
Anesthesia: General | Site: Bladder | Wound class: Clean Contaminated

## 2017-03-19 MED ORDER — CELECOXIB 200 MG PO CAPS
400.0000 mg | ORAL_CAPSULE | Freq: Once | ORAL | Status: AC
  Administered 2017-03-19: 400 mg via ORAL

## 2017-03-19 MED ORDER — FENTANYL CITRATE (PF) 100 MCG/2ML IJ SOLN
INTRAMUSCULAR | Status: AC
  Filled 2017-03-19: qty 2

## 2017-03-19 MED ORDER — FAMOTIDINE 20 MG PO TABS
ORAL_TABLET | ORAL | Status: AC
  Administered 2017-03-19: 20 mg via ORAL
  Filled 2017-03-19: qty 1

## 2017-03-19 MED ORDER — LACTATED RINGERS IV SOLN
INTRAVENOUS | Status: DC
  Administered 2017-03-19 (×2): via INTRAVENOUS

## 2017-03-19 MED ORDER — BUPIVACAINE LIPOSOME 1.3 % IJ SUSP
INTRAMUSCULAR | Status: DC | PRN
Start: 2017-03-19 — End: 2017-03-19
  Administered 2017-03-19: 20 mL

## 2017-03-19 MED ORDER — OXYCODONE HCL 5 MG PO TABS
10.0000 mg | ORAL_TABLET | ORAL | Status: DC | PRN
  Filled 2017-03-19 (×2): qty 2

## 2017-03-19 MED ORDER — PHENYLEPHRINE HCL 10 MG/ML IJ SOLN
INTRAMUSCULAR | Status: DC | PRN
  Administered 2017-03-19: 100 ug via INTRAVENOUS
  Administered 2017-03-19: 200 ug via INTRAVENOUS
  Administered 2017-03-19 (×2): 100 ug via INTRAVENOUS

## 2017-03-19 MED ORDER — IBUPROFEN 600 MG PO TABS
600.0000 mg | ORAL_TABLET | Freq: Four times a day (QID) | ORAL | Status: DC
  Administered 2017-03-20 (×2): 600 mg via ORAL
  Filled 2017-03-19 (×10): qty 1

## 2017-03-19 MED ORDER — DEXAMETHASONE SODIUM PHOSPHATE 10 MG/ML IJ SOLN
INTRAMUSCULAR | Status: AC
  Filled 2017-03-19: qty 1

## 2017-03-19 MED ORDER — GLYCOPYRROLATE 0.2 MG/ML IJ SOLN
INTRAMUSCULAR | Status: AC
  Filled 2017-03-19: qty 1

## 2017-03-19 MED ORDER — DOCUSATE SODIUM 100 MG PO CAPS
100.0000 mg | ORAL_CAPSULE | Freq: Two times a day (BID) | ORAL | Status: DC
  Administered 2017-03-19 – 2017-03-20 (×2): 100 mg via ORAL
  Filled 2017-03-19 (×2): qty 1

## 2017-03-19 MED ORDER — MENTHOL 3 MG MT LOZG
1.0000 | LOZENGE | OROMUCOSAL | Status: DC | PRN
  Filled 2017-03-19: qty 9

## 2017-03-19 MED ORDER — LIDOCAINE HCL (PF) 2 % IJ SOLN
INTRAMUSCULAR | Status: AC
  Filled 2017-03-19: qty 2

## 2017-03-19 MED ORDER — SIMETHICONE 80 MG PO CHEW: 160.0000 mg | CHEWABLE_TABLET | Freq: Four times a day (QID) | ORAL | Status: DC | PRN

## 2017-03-19 MED ORDER — KETOROLAC TROMETHAMINE 30 MG/ML IJ SOLN
INTRAMUSCULAR | Status: AC
  Filled 2017-03-19: qty 1

## 2017-03-19 MED ORDER — GABAPENTIN 300 MG PO CAPS
ORAL_CAPSULE | ORAL | Status: AC
  Administered 2017-03-19: 600 mg
  Filled 2017-03-19: qty 2

## 2017-03-19 MED ORDER — MIDAZOLAM HCL 2 MG/2ML IJ SOLN
INTRAMUSCULAR | Status: AC
  Filled 2017-03-19: qty 2

## 2017-03-19 MED ORDER — HEPARIN SODIUM (PORCINE) 5000 UNIT/ML IJ SOLN
5000.0000 [IU] | Freq: Once | INTRAMUSCULAR | Status: AC
  Administered 2017-03-19: 5000 [IU] via SUBCUTANEOUS

## 2017-03-19 MED ORDER — DEXAMETHASONE SODIUM PHOSPHATE 10 MG/ML IJ SOLN
INTRAMUSCULAR | Status: DC | PRN
  Administered 2017-03-19: 10 mg via INTRAVENOUS

## 2017-03-19 MED ORDER — SODIUM CHLORIDE 0.9 % IV SOLN: 50.0000 mL/h | INTRAVENOUS | Status: DC

## 2017-03-19 MED ORDER — SUGAMMADEX SODIUM 200 MG/2ML IV SOLN
INTRAVENOUS | Status: AC
  Filled 2017-03-19: qty 2

## 2017-03-19 MED ORDER — SUCCINYLCHOLINE CHLORIDE 20 MG/ML IJ SOLN
INTRAMUSCULAR | Status: AC
  Filled 2017-03-19: qty 1

## 2017-03-19 MED ORDER — ONDANSETRON HCL 4 MG PO TABS: 4.0000 mg | ORAL_TABLET | Freq: Four times a day (QID) | ORAL | Status: DC | PRN

## 2017-03-19 MED ORDER — ONDANSETRON HCL 4 MG/2ML IJ SOLN: 4.0000 mg | Freq: Once | INTRAMUSCULAR | Status: DC | PRN

## 2017-03-19 MED ORDER — ONDANSETRON HCL 4 MG/2ML IJ SOLN
INTRAMUSCULAR | Status: DC | PRN
  Administered 2017-03-19: 4 mg via INTRAVENOUS

## 2017-03-19 MED ORDER — KETOROLAC TROMETHAMINE 30 MG/ML IJ SOLN
15.0000 mg | Freq: Once | INTRAMUSCULAR | Status: AC
  Administered 2017-03-19: 15 mg via INTRAVENOUS

## 2017-03-19 MED ORDER — FENTANYL CITRATE (PF) 100 MCG/2ML IJ SOLN
25.0000 ug | INTRAMUSCULAR | Status: DC | PRN
  Administered 2017-03-19 (×5): 25 ug via INTRAVENOUS

## 2017-03-19 MED ORDER — ACETAMINOPHEN 500 MG PO TABS
1000.0000 mg | ORAL_TABLET | Freq: Once | ORAL | Status: AC
  Administered 2017-03-19: 1000 mg via ORAL

## 2017-03-19 MED ORDER — PROPOFOL 10 MG/ML IV BOLUS
INTRAVENOUS | Status: AC
  Filled 2017-03-19: qty 20

## 2017-03-19 MED ORDER — MIDAZOLAM HCL 2 MG/2ML IJ SOLN
INTRAMUSCULAR | Status: AC
Start: 2017-03-19 — End: ?
  Filled 2017-03-19: qty 2

## 2017-03-19 MED ORDER — EPHEDRINE SULFATE 50 MG/ML IJ SOLN
INTRAMUSCULAR | Status: AC
  Filled 2017-03-19: qty 1

## 2017-03-19 MED ORDER — ONDANSETRON HCL 4 MG/2ML IJ SOLN: 4.0000 mg | Freq: Four times a day (QID) | INTRAMUSCULAR | Status: DC | PRN

## 2017-03-19 MED ORDER — FENTANYL CITRATE (PF) 250 MCG/5ML IJ SOLN
INTRAMUSCULAR | Status: AC
  Filled 2017-03-19: qty 5

## 2017-03-19 MED ORDER — HEPARIN SODIUM (PORCINE) 5000 UNIT/ML IJ SOLN
INTRAMUSCULAR | Status: AC
  Administered 2017-03-19: 5000 [IU] via SUBCUTANEOUS
  Filled 2017-03-19: qty 1

## 2017-03-19 MED ORDER — ACETAMINOPHEN 500 MG PO TABS
ORAL_TABLET | ORAL | Status: AC
  Administered 2017-03-19: 1000 mg via ORAL
  Filled 2017-03-19: qty 2

## 2017-03-19 MED ORDER — ACETAMINOPHEN 500 MG PO TABS
1000.0000 mg | ORAL_TABLET | Freq: Four times a day (QID) | ORAL | Status: DC
  Administered 2017-03-19 – 2017-03-20 (×3): 1000 mg via ORAL
  Filled 2017-03-19 (×3): qty 2

## 2017-03-19 MED ORDER — FENTANYL CITRATE (PF) 100 MCG/2ML IJ SOLN
INTRAMUSCULAR | Status: AC
Start: 2017-03-19 — End: 2017-03-20
  Filled 2017-03-19: qty 2

## 2017-03-19 MED ORDER — ROCURONIUM BROMIDE 50 MG/5ML IV SOLN
INTRAVENOUS | Status: AC
  Filled 2017-03-19: qty 1

## 2017-03-19 MED ORDER — METHYLENE BLUE 0.5 % INJ SOLN
INTRAVENOUS | Status: AC
  Filled 2017-03-19: qty 10

## 2017-03-19 MED ORDER — FENTANYL CITRATE (PF) 100 MCG/2ML IJ SOLN
INTRAMUSCULAR | Status: DC | PRN
  Administered 2017-03-19 (×6): 25 ug via INTRAVENOUS
  Administered 2017-03-19: 100 ug via INTRAVENOUS
  Administered 2017-03-19: 50 ug via INTRAVENOUS

## 2017-03-19 MED ORDER — CELECOXIB 200 MG PO CAPS
ORAL_CAPSULE | ORAL | Status: AC
  Administered 2017-03-19: 400 mg via ORAL
  Filled 2017-03-19: qty 2

## 2017-03-19 MED ORDER — LIDOCAINE HCL (CARDIAC) 20 MG/ML IV SOLN
INTRAVENOUS | Status: DC | PRN
  Administered 2017-03-19: 100 mg via INTRAVENOUS

## 2017-03-19 MED ORDER — ONDANSETRON HCL 4 MG/2ML IJ SOLN
INTRAMUSCULAR | Status: AC
  Filled 2017-03-19: qty 2

## 2017-03-19 MED ORDER — OXYCODONE HCL 5 MG PO TABS
5.0000 mg | ORAL_TABLET | ORAL | Status: DC | PRN
  Administered 2017-03-19 – 2017-03-20 (×2): 5 mg via ORAL
  Filled 2017-03-19: qty 1

## 2017-03-19 MED ORDER — CEFAZOLIN SODIUM-DEXTROSE 2-4 GM/100ML-% IV SOLN
INTRAVENOUS | Status: AC
  Filled 2017-03-19: qty 100

## 2017-03-19 MED ORDER — ROCURONIUM BROMIDE 100 MG/10ML IV SOLN
INTRAVENOUS | Status: DC | PRN
  Administered 2017-03-19: 50 mg via INTRAVENOUS

## 2017-03-19 MED ORDER — HYDROMORPHONE HCL 1 MG/ML IJ SOLN: 0.2000 mg | INTRAMUSCULAR | Status: DC | PRN

## 2017-03-19 MED ORDER — GABAPENTIN 600 MG PO TABS
600.0000 mg | ORAL_TABLET | Freq: Once | ORAL | Status: DC
  Filled 2017-03-19: qty 1

## 2017-03-19 MED ORDER — SODIUM CHLORIDE 0.9 % IJ SOLN
INTRAMUSCULAR | Status: AC
  Filled 2017-03-19: qty 10

## 2017-03-19 MED ORDER — SUGAMMADEX SODIUM 200 MG/2ML IV SOLN
INTRAVENOUS | Status: DC | PRN
  Administered 2017-03-19: 190 mg via INTRAVENOUS

## 2017-03-19 MED ORDER — PROPOFOL 10 MG/ML IV BOLUS
INTRAVENOUS | Status: DC | PRN
  Administered 2017-03-19: 160 mg via INTRAVENOUS

## 2017-03-19 MED ORDER — METHYLENE BLUE 0.5 % INJ SOLN
INTRAVENOUS | Status: DC | PRN
  Administered 2017-03-19: 10 mL

## 2017-03-19 MED ORDER — FAMOTIDINE 20 MG PO TABS
20.0000 mg | ORAL_TABLET | Freq: Once | ORAL | Status: AC
  Administered 2017-03-19: 20 mg via ORAL

## 2017-03-19 MED ORDER — LIDOCAINE-EPINEPHRINE 1 %-1:100000 IJ SOLN
INTRAMUSCULAR | Status: DC | PRN
  Administered 2017-03-19: 18 mL

## 2017-03-19 MED ORDER — MIDAZOLAM HCL 2 MG/2ML IJ SOLN
INTRAMUSCULAR | Status: DC | PRN
  Administered 2017-03-19: 2 mg via INTRAVENOUS

## 2017-03-19 SURGICAL SUPPLY — 63 items
BAG URO DRAIN 2000ML W/SPOUT (MISCELLANEOUS) ×4 IMPLANT
BLADE SURG SZ10 CARB STEEL (BLADE) ×4 IMPLANT
CANISTER SUCT 1200ML W/VALVE (MISCELLANEOUS) ×4 IMPLANT
CATH FOLEY 2WAY  5CC 16FR (CATHETERS) ×1
CATH ROBINSON RED A/P 16FR (CATHETERS) ×4 IMPLANT
CATH URTH 16FR FL 2W BLN LF (CATHETERS) ×3 IMPLANT
COVER LIGHT HANDLE STERIS (MISCELLANEOUS) ×4 IMPLANT
DRAPE LAPAROTOMY 100X77 ABD (DRAPES) IMPLANT
DRAPE LAPAROTOMY TRNSV 106X77 (MISCELLANEOUS) IMPLANT
DRAPE PERI LITHO V/GYN (MISCELLANEOUS) ×4 IMPLANT
DRAPE SHEET LG 3/4 BI-LAMINATE (DRAPES) ×4 IMPLANT
DRAPE UNDER BUTTOCK W/FLU (DRAPES) ×4 IMPLANT
DRSG TELFA 3X8 NADH (GAUZE/BANDAGES/DRESSINGS) ×4 IMPLANT
ELECT BLADE 6 FLAT ULTRCLN (ELECTRODE) IMPLANT
ELECT CAUTERY BLADE 6.4 (BLADE) ×4 IMPLANT
ELECT REM PT RETURN 9FT ADLT (ELECTROSURGICAL) ×4
ELECTRODE REM PT RTRN 9FT ADLT (ELECTROSURGICAL) ×3 IMPLANT
GAUZE PACK 2X3YD (MISCELLANEOUS) ×4 IMPLANT
GLOVE BIOGEL PI IND STRL 6.5 (GLOVE) ×6 IMPLANT
GLOVE BIOGEL PI INDICATOR 6.5 (GLOVE) ×2
GLOVE PI ORTHOPRO 6.5 (GLOVE) ×2
GLOVE PI ORTHOPRO STRL 6.5 (GLOVE) ×6 IMPLANT
GLOVE SURG SYN 6.5 ES PF (GLOVE) ×4 IMPLANT
GOWN STRL REUS W/ TWL LRG LVL3 (GOWN DISPOSABLE) ×9 IMPLANT
GOWN STRL REUS W/ TWL XL LVL3 (GOWN DISPOSABLE) ×3 IMPLANT
GOWN STRL REUS W/TWL LRG LVL3 (GOWN DISPOSABLE) ×3
GOWN STRL REUS W/TWL XL LVL3 (GOWN DISPOSABLE) ×1
IRRIGATION STRYKERFLOW (MISCELLANEOUS) IMPLANT
IRRIGATOR STRYKERFLOW (MISCELLANEOUS)
KIT RM TURNOVER CYSTO AR (KITS) ×4 IMPLANT
LABEL OR SOLS (LABEL) ×4 IMPLANT
NDL MAYO CATGUT SZ4 (NEEDLE) IMPLANT
NEEDLE HYPO 22GX1.5 SAFETY (NEEDLE) ×4 IMPLANT
NS IRRIG 1000ML POUR BTL (IV SOLUTION) ×4 IMPLANT
NS IRRIG 500ML POUR BTL (IV SOLUTION) ×4 IMPLANT
PACK BASIN MAJOR ARMC (MISCELLANEOUS) ×4 IMPLANT
PACK BASIN MINOR ARMC (MISCELLANEOUS) ×4 IMPLANT
PAD OB MATERNITY 4.3X12.25 (PERSONAL CARE ITEMS) ×4 IMPLANT
SET CYSTO W/LG BORE CLAMP LF (SET/KITS/TRAYS/PACK) ×4 IMPLANT
SPONGE LAP 18X18 5 PK (GAUZE/BANDAGES/DRESSINGS) IMPLANT
SPONGE XRAY 4X4 16PLY STRL (MISCELLANEOUS) ×8 IMPLANT
STAPLER SKIN PROX 35W (STAPLE) ×4 IMPLANT
STRIP CLOSURE SKIN 1/2X4 (GAUZE/BANDAGES/DRESSINGS) IMPLANT
SUT CHROMIC 3 0 SH 27 (SUTURE) ×8 IMPLANT
SUT ETHIBOND CT1 BRD #0 30IN (SUTURE) IMPLANT
SUT ETHIBOND NAB CT1 #1 30IN (SUTURE) IMPLANT
SUT PDS 2-0 27IN (SUTURE) IMPLANT
SUT VIC AB 0 CT1 27 (SUTURE) ×3
SUT VIC AB 0 CT1 27XCR 8 STRN (SUTURE) ×9 IMPLANT
SUT VIC AB 0 CT1 36 (SUTURE) ×4 IMPLANT
SUT VIC AB 1 CT1 36 (SUTURE) IMPLANT
SUT VIC AB 2-0 CT1 (SUTURE) ×4 IMPLANT
SUT VIC AB 2-0 SH 27 (SUTURE) ×2
SUT VIC AB 2-0 SH 27XBRD (SUTURE) ×6 IMPLANT
SUT VIC AB 3-0 SH 27 (SUTURE)
SUT VIC AB 3-0 SH 27X BRD (SUTURE) IMPLANT
SUT VIC AB 4-0 PS2 18 (SUTURE) IMPLANT
SYR 30ML LL (SYRINGE) ×4 IMPLANT
SYR BULB IRRIG 60ML STRL (SYRINGE) ×4 IMPLANT
SYRINGE 10CC LL (SYRINGE) ×4 IMPLANT
SYRINGE IRR TOOMEY STRL 70CC (SYRINGE) ×4 IMPLANT
TRAY FOLEY W/METER SILVER 16FR (SET/KITS/TRAYS/PACK) ×4 IMPLANT
TRAY PREP VAG/GEN (MISCELLANEOUS) ×4 IMPLANT

## 2017-03-19 NOTE — Transfer of Care (Signed)
Immediate Anesthesia Transfer of Care Note  Patient: Angel Hernandez  Procedure(s) Performed: Procedure(s): HYSTERECTOMY VAGINAL (N/A) BILATERAL SALPINGECTOMY (Bilateral) CYSTOSCOPY (N/A)  Patient Location: PACU  Anesthesia Type:General  Level of Consciousness: oriented and patient cooperative  Airway & Oxygen Therapy: Patient Spontanous Breathing and Patient connected to face mask oxygen  Post-op Assessment: Report given to RN and Post -op Vital signs reviewed and stable  Post vital signs: Reviewed and stable  Last Vitals:  Vitals:   03/19/17 0953  BP: 123/66  Pulse: 83  Resp: 20  Temp: 36.9 C  SpO2: 100%    Last Pain:  Vitals:   03/19/17 0953  TempSrc: Oral         Complications: No apparent anesthesia complications

## 2017-03-19 NOTE — Anesthesia Post-op Follow-up Note (Signed)
Anesthesia QCDR form completed.        

## 2017-03-19 NOTE — Anesthesia Preprocedure Evaluation (Signed)
Anesthesia Evaluation  Patient identified by MRN, date of birth, ID band Patient awake    Reviewed: Allergy & Precautions, NPO status , Patient's Chart, lab work & pertinent test results  History of Anesthesia Complications Negative for: history of anesthetic complications  Airway Mallampati: II       Dental   Pulmonary neg sleep apnea, neg COPD, Current Smoker,           Cardiovascular (-) hypertension(-) Past MI and (-) CHF (-) dysrhythmias (-) Valvular Problems/Murmurs     Neuro/Psych neg Seizures    GI/Hepatic Neg liver ROS, GERD  Medicated and Controlled,  Endo/Other  neg diabetesHypothyroidism   Renal/GU negative Renal ROS     Musculoskeletal   Abdominal   Peds  Hematology   Anesthesia Other Findings   Reproductive/Obstetrics                             Anesthesia Physical Anesthesia Plan  ASA: II  Anesthesia Plan: General   Post-op Pain Management:    Induction:   PONV Risk Score and Plan: 3 and Ondansetron, Dexamethasone and Midazolam  Airway Management Planned:   Additional Equipment:   Intra-op Plan:   Post-operative Plan:   Informed Consent: I have reviewed the patients History and Physical, chart, labs and discussed the procedure including the risks, benefits and alternatives for the proposed anesthesia with the patient or authorized representative who has indicated his/her understanding and acceptance.     Plan Discussed with:   Anesthesia Plan Comments:         Anesthesia Quick Evaluation

## 2017-03-19 NOTE — Interval H&P Note (Signed)
History and Physical Interval Note:  03/19/2017 10:21 AM  Angel Hernandez  has presented today for surgery, with the diagnosis of Dysfunctional Uterine Bleeding  The various methods of treatment have been discussed with the patient and family. After consideration of risks, benefits and other options for treatment, the patient has consented to  Procedure(s): HYSTERECTOMY VAGINAL (N/A) BILATERAL SALPINGECTOMY (Bilateral) as a surgical intervention .  The patient's history has been reviewed, patient examined, no change in status, stable for surgery.  I have reviewed the patient's chart, vitals and labs.  Questions were answered to the patient's satisfaction.     Chelsea C Ward  BP 123/66   Pulse 83   Temp 98.5 F (36.9 C) (Oral)   Resp 20   LMP 03/09/2017   SpO2 100%

## 2017-03-19 NOTE — Anesthesia Post-op Follow-up Note (Incomplete)
Anesthesia QCDR form completed.        

## 2017-03-19 NOTE — Anesthesia Procedure Notes (Signed)
Procedure Name: Intubation Date/Time: 03/19/2017 3:53 PM Performed by: Doreen Salvage Pre-anesthesia Checklist: Patient identified, Patient being monitored, Timeout performed, Emergency Drugs available and Suction available Patient Re-evaluated:Patient Re-evaluated prior to induction Oxygen Delivery Method: Circle system utilized Preoxygenation: Pre-oxygenation with 100% oxygen Induction Type: IV induction Ventilation: Mask ventilation without difficulty Laryngoscope Size: Mac and 3 Grade View: Grade I Tube type: Oral Tube size: 7.0 mm Number of attempts: 1 Airway Equipment and Method: Stylet Placement Confirmation: ETT inserted through vocal cords under direct vision,  positive ETCO2 and breath sounds checked- equal and bilateral Secured at: 21 cm Tube secured with: Tape Dental Injury: Teeth and Oropharynx as per pre-operative assessment

## 2017-03-19 NOTE — Op Note (Signed)
Total Vaginal Hysterectomy Operative Note Procedure Date: 03/19/2017  Patient:  Angel Hernandez  46 y.o. female  PRE-OPERATIVE DIAGNOSIS:  Dysfunctional Uterine Bleeding  POST-OPERATIVE DIAGNOSIS:  dysfunctional uterine bleeding  PROCEDURE:  Procedure(s): HYSTERECTOMY VAGINAL (N/A) BILATERAL SALPINGECTOMY (Bilateral) CYSTOSCOPY (N/A)  Bladder repair  SURGEON:  Surgeon(s) and Role:    * Ward, Honor Loh, MD - Primary    * Schermerhorn, Gwen Her, MD  ANESTHESIA:  General via ET  I/O  Total I/O In: 1500 [I.V.:1500] Out: - 400cc urine, 100cc EBL  FINDINGS:  Small uterus, normal ovaries and fallopian tubes bilaterally.  Normal upper abdomen.  SPECIMEN: Uterus, Cervix, and bilateral fallopian tubes  COMPLICATIONS: none apparent  DISPOSITION: vital signs stable to PACU  Indication for Surgery: 46 y.o. [id number] with heavy and intolerable periods refractory to hormonal manipulation requesting definitive treatment.  Risks of surgery were discussed with the patient including but not limited to: bleeding which may require transfusion or reoperation; infection which may require antibiotics; injury to bowel, bladder, ureters or other surrounding organs; need for additional procedures including laparotomy, blood clot, incisional problems and other postoperative/anesthesia complications. Written informed consent was obtained.      PROCEDURE IN DETAIL:  The patient had sequential compression devices applied to her lower extremities while in the preoperative area.  She was then taken to the operating room. IV antibiotics were given. General anesthesia was administered via endotracheal route.  She was placed in the dorsal lithotomy position, and was prepped and draped in a sterile manner. A surgical time-out was performed.    The weighted speculum was inserted into the vagina, and thyroid clamps were placed on the anterior and posterior cervix.  20cc of lidocaine and epinephrine was injected  circumferentially into and around the cervix.  The posterior vagina was grasped with an Alis clamp and scissors were used to enter the posterior peritoneum.  The long billed weighted speculum was then inserted above the rectum.  Haney clamps were then used sequentially to ligate and transect the bilateral uterosacral ligaments, cardinal ligaments, uterine arteries.  The vesicouterine reflection was identified, and with attempt to dissect this, the bladder was entered bluntly via surgeon's finger. The peritoneum was entered anteriorly, and the remainder of the hysterectomy was performed.  The parametrium, and cornua were ligated and transected without difficulty. 0-Vicryl was used in a Actuary with each bite.  At the bilateral cornua, both a free tie was placed prior to suture tie.  The bladder mucosal defects were identified and reapproximated with 3-0 chromic.  The bladder was then backfilled with methylene blue-stained saline, and another defect was identified.  3-0 chromic was used to close this area, and again the bladder was back-filled.  Once confirmed the defects were repaired, a running stitch of 2-0 vicryl of the seromusclar layer was reapproximated, covering the mucosal layer in full. A 70-degree cystoscope was then inserted into the urethra.  The bladder was irrigated with 1L of saline to flush out the blue stained fluid. Then it was inflated with 300cc of normal saline, and inspection of the bladder wall was performed.  Bilateral ureteral jets were observed, as was the repaired area in the midline but not at the trigone.  The cystoscope was removed.   The bilateral fallopian tubes were separated from the mesosalpinx with the Haney clamp and suture tied behind the clamp. Hemostasis was noted at all pedicles.    The  vaginal mucosa closed anterior to posterior with a running stitch of 0-vicryl.  Long-acting bupivacaine was injected into the vaginal mucosa and underlying tissues.  A foley  catheter was placed in the bladder to be retained for several days. .   The patient tolerated the procedure well.  All instruments, needles, and sponge counts were correct x 2. The patient was taken to the recovery room in stable condition.   ---- Larey Days, MD Attending Obstetrician and [facility]

## 2017-03-19 NOTE — Anesthesia Postprocedure Evaluation (Signed)
Anesthesia Post Note  Patient: Angel Hernandez  Procedure(s) Performed: Procedure(s) (LRB): HYSTERECTOMY VAGINAL (N/A) BILATERAL SALPINGECTOMY (Bilateral) CYSTOSCOPY (N/A)  Patient location during evaluation: PACU Anesthesia Type: General Level of consciousness: awake and alert and oriented Pain management: pain level controlled Vital Signs Assessment: post-procedure vital signs reviewed and stable Respiratory status: spontaneous breathing, nonlabored ventilation and respiratory function stable Cardiovascular status: blood pressure returned to baseline and stable Postop Assessment: no signs of nausea or vomiting Anesthetic complications: no     Last Vitals:  Vitals:   03/19/17 1935 03/19/17 1940  BP:  (!) 107/53  Pulse: 65 72  Resp: 14 20  Temp:  37.4 C  SpO2: 95% 97%    Last Pain:  Vitals:   03/19/17 1935  TempSrc:   PainSc: 5                  Tyrees Chopin

## 2017-03-20 ENCOUNTER — Encounter: Payer: Self-pay | Admitting: Obstetrics & Gynecology

## 2017-03-20 DIAGNOSIS — K219 Gastro-esophageal reflux disease without esophagitis: Secondary | ICD-10-CM | POA: Diagnosis not present

## 2017-03-20 DIAGNOSIS — N938 Other specified abnormal uterine and vaginal bleeding: Secondary | ICD-10-CM | POA: Diagnosis not present

## 2017-03-20 DIAGNOSIS — N72 Inflammatory disease of cervix uteri: Secondary | ICD-10-CM | POA: Diagnosis not present

## 2017-03-20 DIAGNOSIS — Z85850 Personal history of malignant neoplasm of thyroid: Secondary | ICD-10-CM | POA: Diagnosis not present

## 2017-03-20 DIAGNOSIS — D251 Intramural leiomyoma of uterus: Secondary | ICD-10-CM | POA: Diagnosis not present

## 2017-03-20 DIAGNOSIS — N800 Endometriosis of uterus: Secondary | ICD-10-CM | POA: Diagnosis not present

## 2017-03-20 DIAGNOSIS — N838 Other noninflammatory disorders of ovary, fallopian tube and broad ligament: Secondary | ICD-10-CM | POA: Diagnosis not present

## 2017-03-20 DIAGNOSIS — E039 Hypothyroidism, unspecified: Secondary | ICD-10-CM | POA: Diagnosis not present

## 2017-03-20 DIAGNOSIS — N888 Other specified noninflammatory disorders of cervix uteri: Secondary | ICD-10-CM | POA: Diagnosis not present

## 2017-03-20 DIAGNOSIS — F17210 Nicotine dependence, cigarettes, uncomplicated: Secondary | ICD-10-CM | POA: Diagnosis not present

## 2017-03-20 LAB — BASIC METABOLIC PANEL
Anion gap: 6 (ref 5–15)
BUN: 13 mg/dL (ref 6–20)
CO2: 24 mmol/L (ref 22–32)
Calcium: 8.9 mg/dL (ref 8.9–10.3)
Chloride: 108 mmol/L (ref 101–111)
Creatinine, Ser: 0.84 mg/dL (ref 0.44–1.00)
GFR calc Af Amer: >60 mL/min (ref >60)
GFR calc non Af Amer: >60 mL/min (ref >60)
Glucose, Bld: 129 mg/dL — ABNORMAL HIGH (ref 65–99)
Potassium: 4.4 mmol/L (ref 3.5–5.1)
Sodium: 138 mmol/L (ref 135–145)

## 2017-03-20 LAB — CBC
HCT: 39.2 % (ref 35.0–47.0)
Hemoglobin: 13.5 g/dL (ref 12.0–16.0)
MCH: 31.3 pg (ref 26.0–34.0)
MCHC: 34.3 g/dL (ref 32.0–36.0)
MCV: 91.3 fL (ref 80.0–100.0)
Platelets: 278 10*3/uL (ref 150–440)
RBC: 4.29 MIL/uL (ref 3.80–5.20)
RDW: 13.6 % (ref 11.5–14.5)
WBC: 25 10*3/uL — ABNORMAL HIGH (ref 3.6–11.0)

## 2017-03-20 MED ORDER — NITROFURANTOIN MONOHYD MACRO 100 MG PO CAPS: 100.0000 mg | ORAL_CAPSULE | Freq: Two times a day (BID) | ORAL | 0 refills | Status: DC

## 2017-03-20 MED ORDER — IBUPROFEN 600 MG PO TABS: 600.0000 mg | ORAL_TABLET | Freq: Four times a day (QID) | ORAL | 0 refills | Status: DC

## 2017-03-20 MED ORDER — FAMOTIDINE 20 MG PO TABS
20.0000 mg | ORAL_TABLET | Freq: Two times a day (BID) | ORAL | Status: DC
  Administered 2017-03-20: 20 mg via ORAL
  Filled 2017-03-20: qty 1

## 2017-03-20 MED ORDER — NITROFURANTOIN MONOHYD MACRO 100 MG PO CAPS
100.0000 mg | ORAL_CAPSULE | Freq: Two times a day (BID) | ORAL | Status: DC
  Filled 2017-03-20: qty 1

## 2017-03-20 MED ORDER — OXYCODONE HCL 5 MG PO TABS: 5.0000 mg | ORAL_TABLET | ORAL | 0 refills | Status: DC | PRN

## 2017-03-20 NOTE — Progress Notes (Signed)
Converted foley to leg bag.  Discharge instructions given.

## 2017-03-20 NOTE — Progress Notes (Signed)
Reviewed all patients discharge instructions and handouts regarding vaginal bleeding, no intercourse and care for leg bag. Patient verbalized the need to attend follow-up appointment Monday 8/27 at 1:15pm  All questions have been answered at this time. Patient discharged via wheelchair with axillary.

## 2017-03-20 NOTE — Discharge Summary (Signed)
Gynecology Physician Postoperative Discharge Summary  Patient ID: Angel Hernandez MRN: [id number] DOB/AGE: 12/11/1970 46 y.o.  Admit Date: 03/19/2017 Discharge Date: 03/20/2017  Preoperative Diagnoses: DUB POST OP diagnoses:  same  Procedures: Procedure(s) (LRB): HYSTERECTOMY VAGINAL (N/A) BILATERAL SALPINGECTOMY (Bilateral) CYSTOSCOPY (N/A)  Repair cystotomy  CBC Latest Ref Rng & Units 03/20/2017 03/13/2017 09/20/2015  WBC 3.6 - 11.0 K/uL 25.0(H) 9.3 10.2  Hemoglobin 12.0 - 16.0 g/dL 13.5 14.5 14.6  Hematocrit 35.0 - 47.0 % 39.2 42.0 43.3  Platelets 150 - 440 K/uL 278 309 343.0    Hospital Course:  Angel Hernandez is a 46 y.o. [id number]  admitted for scheduled surgery.  She underwent the procedures as mentioned above, her operation was uncomplicated. For further details about surgery, please refer to the operative report. Patient had an uncomplicated postoperative course. By time of discharge on POD#1, her pain was controlled on oral pain medications; she was ambulating without difficulty, tolerating regular diet and passing flatus. She retains her foley due to bladder injury and repair.  She was deemed stable for discharge to home.   Discharge Exam: Blood pressure (!) 112/44, pulse 64, temperature 98 F (36.7 C), temperature source Oral, resp. rate 18, height 5\' 7"  (1.702 m), weight 92.1 kg (203 lb), last menstrual period 03/09/2017, SpO2 98 %. General appearance: alert and no distress  Resp: clear to auscultation bilaterally, normal respiratory effort Cardio: regular rate and rhythm  GI: soft, non-tender; bowel sounds normal; no masses, no organomegaly.  Pelvic: scant blood on pad  Extremities: extremities normal, atraumatic, no cyanosis or edema and Homans sign is negative, no sign of DVT  Discharged Condition: Stable  Disposition: Final discharge disposition not confirmed  Discharge Instructions    Call MD for:  persistant dizziness or light-headedness    Complete by:  As  directed    Call MD for:  persistant nausea and vomiting    Complete by:  As directed    Call MD for:  severe uncontrolled pain    Complete by:  As directed    Diet - low sodium heart healthy    Complete by:  As directed    Increase activity slowly    Complete by:  As directed      Allergies as of 03/20/2017   No Known Allergies     Medication List    STOP taking these medications   amoxicillin-clavulanate 875-125 MG tablet Commonly known as:  AUGMENTIN   tiZANidine 4 MG tablet Commonly known as:  ZANAFLEX     TAKE these medications   CLEAR EYES REDNESS RELIEF 0.012-0.2 % Soln Generic drug:  naphazoline-glycerin Place 1-2 drops into both eyes 4 (four) times daily as needed for eye irritation.   ibuprofen 600 MG tablet Commonly known as:  ADVIL,MOTRIN Take 1 tablet (600 mg total) by mouth every 6 (six) hours. What changed:  medication strength  how much to take  when to take this  reasons to take this  additional instructions   levothyroxine 112 MCG tablet Commonly known as:  SYNTHROID, LEVOTHROID TAKE 1 TABLET BY MOUTH DAILY   nitrofurantoin (macrocrystal-monohydrate) 100 MG capsule Commonly known as:  MACROBID Take 1 capsule (100 mg total) by mouth every 12 (twelve) hours.   oxyCODONE 5 MG immediate release tablet Commonly known as:  Oxy IR/ROXICODONE Take 1 tablet (5 mg total) by mouth every 4 (four) hours as needed for moderate pain.      Follow-up Information    Ward, Honor Loh, MD Follow up  in 1 week(s).   Specialty:  Obstetrics and Gynecology Why:  Monday 8/27 Contact information: [place] [address] [phone]           Signed:  [place] Attending [facility]N [facility]

## 2017-03-20 NOTE — Discharge Instructions (Signed)
Discharge instructions:  Call office if you have any of the following: fever >101 F, chills, excessive vaginal bleeding, incision drainage or problems, leg pain or redness, or any other concerns.   Activity: Do not lift > 10 lbs for 8 weeks.  No intercourse or tampons for 8 weeks.  No driving for 1-2 weeks.   Take 600mg  Ibuprofen and 1000mg  Tylenol around the clock, every 6 hours for at least the first 3-5 days.  After this you can take as needed.  This will help decrease inflammation and promote healing.  The narcotics you'll take just as needed, as they just trick your brain into thinking its not in pain.    Please don't limit yourself in terms of routine activity.  You will be able to do most things, although they may take longer to do or be a little painful.  You can do it!  Don't be a hero, but don't be a wimp either!   Your Foley catheter will remain in place for 7 days to keep your bladder decompressed.  You will have an antibiotic to take during this time.  Next Monday I will arrange for a test to see if your bladder is healed and then we can remove the foley catheter.  Please be patient with this process!

## 2017-03-22 LAB — SURGICAL PATHOLOGY

## 2017-03-23 ENCOUNTER — Other Ambulatory Visit: Payer: Self-pay | Admitting: Obstetrics & Gynecology

## 2017-05-02 DIAGNOSIS — Z85850 Personal history of malignant neoplasm of thyroid: Secondary | ICD-10-CM | POA: Diagnosis not present

## 2017-05-02 DIAGNOSIS — C73 Malignant neoplasm of thyroid gland: Secondary | ICD-10-CM | POA: Diagnosis not present

## 2017-05-02 DIAGNOSIS — E890 Postprocedural hypothyroidism: Secondary | ICD-10-CM | POA: Diagnosis not present

## 2017-07-31 ENCOUNTER — Ambulatory Visit
Admission: EM | Admit: 2017-07-31 | Discharge: 2017-07-31 | Disposition: A | Discharge status: Home / Self Care | Payer: BLUE CROSS/BLUE SHIELD | Attending: Family Medicine | Admitting: Family Medicine

## 2017-07-31 ENCOUNTER — Other Ambulatory Visit: Payer: Self-pay

## 2017-07-31 DIAGNOSIS — J0100 Acute maxillary sinusitis, unspecified: Secondary | ICD-10-CM | POA: Diagnosis not present

## 2017-07-31 DIAGNOSIS — R05 Cough: Secondary | ICD-10-CM | POA: Diagnosis not present

## 2017-07-31 MED ORDER — AMOXICILLIN 875 MG PO TABS: 875.0000 mg | ORAL_TABLET | Freq: Two times a day (BID) | ORAL | 0 refills | Status: DC

## 2017-07-31 MED ORDER — BENZONATATE 100 MG PO CAPS: 100.0000 mg | ORAL_CAPSULE | Freq: Three times a day (TID) | ORAL | 0 refills | Status: DC | PRN

## 2017-07-31 NOTE — ED Triage Notes (Signed)
Pt reports she started with "head cold" on Christmas day. Now has moved into her chest and has cough, mostly non-productive. Mild nasal congestion. No fever. Has headache pain across forehead and into temples. Ears feel full.

## 2017-07-31 NOTE — ED Provider Notes (Signed)
[facility]    CSN: [id number] Arrival date & time: 07/31/17  0825     History   Chief Complaint Chief Complaint  Patient presents with  . Cough    HPI Angel Hernandez is a 47 y.o. female.   The history is provided by the patient.  Cough  Associated symptoms: headaches   Associated symptoms: no wheezing   URI  Presenting symptoms: congestion, cough, facial pain and fatigue   Severity:  Moderate Onset quality:  Sudden Duration:  1 week Timing:  Constant Chronicity:  New Relieved by:  Nothing Worsened by:  Nothing Ineffective treatments:  OTC medications Associated symptoms: headaches and sinus pain   Associated symptoms: no wheezing   Risk factors: sick contacts   Risk factors: not elderly, no chronic cardiac disease, no chronic kidney disease, no chronic respiratory disease, no diabetes mellitus, no immunosuppression, no recent illness and no recent travel     Past Medical History:  Diagnosis Date  . GERD (gastroesophageal reflux disease)   . Headache    migraines  . Hypothyroid   . Thyroid cancer ([place]) 2001  . Tobacco abuse     Patient Active Problem List   Diagnosis Date Noted  . S/P hysterectomy 03/19/2017  . Irregular menses 06/04/2014  . Thoracic back pain 06/04/2014  . Fatigue 06/04/2014  . 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 Hypothyroid   . Tobacco abuse   . Thyroid cancer [facility])     Past Surgical History:  Procedure Laterality Date  . BILATERAL SALPINGECTOMY Bilateral 03/19/2017   Procedure: BILATERAL SALPINGECTOMY;  Surgeon: Ward, Honor Loh, MD;  Location: [facility] ORS;  Service: Gynecology;  Laterality: Bilateral;  . CESAREAN SECTION    . CYSTOSCOPY N/A 03/19/2017   Procedure: CYSTOSCOPY;  Surgeon: Ward, Honor Loh, MD;  Location: [facility] ORS;  Service: Gynecology;  Laterality: N/A;  . DILATION AND CURETTAGE OF UTERUS    . KNEE ARTHROSCOPY Left   . thyroidectomy  2001  . VAGINAL HYSTERECTOMY N/A 03/19/2017   Procedure: HYSTERECTOMY  VAGINAL;  Surgeon: Ward, Honor Loh, MD;  Location: [facility] ORS;  Service: Gynecology;  Laterality: N/A;    OB History    Gravida Para Term Preterm AB Living   4 4 4     4    SAB TAB Ectopic Multiple Live Births                   Home Medications    Prior to Admission medications   Medication Sig Start Date End Date Taking? Authorizing Provider  amoxicillin (AMOXIL) 875 MG tablet Take 1 tablet (875 mg total) by mouth 2 (two) times daily. 07/31/17   Norval Gable, MD  benzonatate (TESSALON) 100 MG capsule Take 1 capsule (100 mg total) by mouth 3 (three) times daily as needed. 07/31/17   Norval Gable, MD  ibuprofen (ADVIL,MOTRIN) 600 MG tablet Take 1 tablet (600 mg total) by mouth every 6 (six) hours. 03/20/17   Ward, Honor Loh, MD  levothyroxine (SYNTHROID, LEVOTHROID) 112 MCG tablet TAKE 1 TABLET BY MOUTH DAILY 11/21/16   Copland, Frederico Hamman, MD  naphazoline-glycerin (CLEAR EYES REDNESS RELIEF) 0.012-0.2 % SOLN Place 1-2 drops into both eyes 4 (four) times daily as needed for eye irritation.    [provider]  nitrofurantoin, macrocrystal-monohydrate, (MACROBID) 100 MG capsule Take 1 capsule (100 mg total) by mouth every 12 (twelve) hours. 03/20/17   Ward, Honor Loh, MD  oxyCODONE (OXY IR/ROXICODONE) 5 MG immediate release tablet Take 1 tablet (5 mg  total) by mouth every 4 (four) hours as needed for moderate pain. 03/20/17   Ward, Honor Loh, MD    Family History Family History  Problem Relation Age of Onset  . Alzheimer's disease Father   . Cancer Father     Social History Social History   Tobacco Use  . Smoking status: Current Every Day Smoker    Packs/day: 0.50    Types: Cigarettes  . Smokeless tobacco: Never Used  Substance Use Topics  . Alcohol use: Yes    Comment: social  . Drug use: No     Allergies   Patient has no known allergies.   Review of Systems Review of Systems  Constitutional: Positive for fatigue.  HENT: Positive for congestion and sinus pain.     Respiratory: Positive for cough. Negative for wheezing.   Neurological: Positive for headaches.     Physical Exam Triage Vital Signs ED Triage Vitals  Enc Vitals Group     BP 07/31/17 0837 (!) 146/72     Pulse Rate 07/31/17 0837 64     Resp 07/31/17 0837 18     Temp 07/31/17 0837 98.1 F (36.7 C)     Temp Source 07/31/17 0837 Oral     SpO2 07/31/17 0837 99 %     Weight 07/31/17 0839 203 lb (92.1 kg)     Height 07/31/17 0839 5\' 7"  (1.702 m)     Head Circumference --      Peak Flow --      Pain Score 07/31/17 0836 1     Pain Loc --      Pain Edu? --      Excl. in [place]? --    No data found.  Updated Vital Signs BP (!) 146/72 (BP Location: Left Arm)   Pulse 64   Temp 98.1 F (36.7 C) (Oral)   Resp 18   Ht 5\' 7"  (1.702 m)   Wt 203 lb (92.1 kg)   LMP 03/09/2017   SpO2 99%   BMI 31.79 kg/m   Visual Acuity Right Eye Distance:   Left Eye Distance:   Bilateral Distance:    Right Eye Near:   Left Eye Near:    Bilateral Near:     Physical Exam  Constitutional: She appears well-developed and well-nourished. No distress.  HENT:  Head: Normocephalic and atraumatic.  Right Ear: Tympanic membrane, external ear and ear canal normal.  Left Ear: Tympanic membrane, external ear and ear canal normal.  Nose: Mucosal edema and rhinorrhea present. No nose lacerations, sinus tenderness, nasal deformity, septal deviation or nasal septal hematoma. No epistaxis.  No foreign bodies. Right sinus exhibits maxillary sinus tenderness and frontal sinus tenderness. Left sinus exhibits maxillary sinus tenderness and frontal sinus tenderness.  Mouth/Throat: Uvula is midline, oropharynx is clear and moist and mucous membranes are normal. No oropharyngeal exudate.  Eyes: Conjunctivae and EOM are normal. Pupils are equal, round, and reactive to light. Right eye exhibits no discharge. Left eye exhibits no discharge. No scleral icterus.  Neck: Normal range of motion. Neck supple. No thyromegaly  present.  Cardiovascular: Normal rate, regular rhythm and normal heart sounds.  Pulmonary/Chest: Effort normal and breath sounds normal. No respiratory distress. She has no wheezes. She has no rales.  Lymphadenopathy:    She has no cervical adenopathy.  Skin: She is not diaphoretic.  Nursing note and vitals reviewed.    UC Treatments / Results  Labs (all labs ordered are listed, but only abnormal results are displayed)  Labs Reviewed - No data to display  EKG  EKG Interpretation None       Radiology No results found.  Procedures Procedures (including critical care time)  Medications Ordered in UC Medications - No data to display   Initial Impression / Assessment and Plan / UC Course  I have reviewed the triage vital signs and the nursing notes.  Pertinent labs & imaging results that were available during my care of the patient were reviewed by me and considered in my medical decision making (see chart for details).       Final Clinical Impressions(s) / UC Diagnoses   Final diagnoses:  Acute maxillary sinusitis, recurrence not specified    ED Discharge Orders        Ordered    amoxicillin (AMOXIL) 875 MG tablet  2 times daily     07/31/17 0856    benzonatate (TESSALON) 100 MG capsule  3 times daily PRN     07/31/17 0856     1. diagnosis reviewed with patient 2. rx as per orders above; reviewed possible side effects, interactions, risks and benefits  3. Follow-up prn if symptoms worsen or don't improve   Controlled Substance Prescriptions [place] Controlled Substance Registry consulted? Not Applicable   Norval Gable, MD 07/31/17 [phone]

## 2018-03-07 ENCOUNTER — Ambulatory Visit: Payer: Self-pay | Admitting: *Deleted

## 2018-03-07 NOTE — Telephone Encounter (Signed)
Pt reports mild "Very slight" intermittent headaches x 2-3 months. 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 Intermittent, last 20 minutes, not daily.  Pt has not had BP checked "For a while." Also reports stress "Brings on the blurred vision." Denies any SOB, one sided weakness, CP, no dizziness. Appt made for tomorrow with D. Carlean Purl.  Care advise given per protocol. Reason for Disposition . [1] MILD-MODERATE headache AND [2] present > 72 hours  Answer Assessment - Initial Assessment Questions 1. LOCATION: "Where does it hurt?"      All over 2. ONSET: "When did the headache start?" (Minutes, hours or days)      2-3 months ago 3. PATTERN: "Does the pain come and go, or has it been constant since it started?"     Intermittent 4. SEVERITY: "How bad is the pain?" and "What does it keep you from doing?"  (e.g., Scale 1-10; mild, moderate, or severe)   - MILD (1-3): doesn't interfere with normal activities    - MODERATE (4-7): interferes with normal activities or awakens from sleep    - SEVERE (8-10): excruciating pain, unable to do any normal activities        Mild, "Very slight" 5. RECURRENT SYMPTOM: "Have you ever had headaches before?" If so, ask: "When was the last time?" and "What happened that time?"      Yes, Dx with ocular migraines several years ago. 6. CAUSE: "What do you think is causing the headache?"     migraine 7. MIGRAINE: "Have you been diagnosed with migraine headaches?" If so, ask: "Is this headache similar?"      Yes "Ocular  migraines" years ago. Had the blurred vision, now with pain 8. HEAD INJURY: "Has there been any recent injury to the head?"      no 9. OTHER SYMPTOMS: "Do you have any other symptoms?" (fever, stiff neck, eye pain, sore throat, cold symptoms)     Blurred vision  Protocols used: HEADACHE-A-AH

## 2018-03-07 NOTE — Telephone Encounter (Signed)
Pt has appt with Glenda Chroman FNP on 03/08/18 at 4pm.

## 2018-03-08 ENCOUNTER — Encounter: Payer: Self-pay | Admitting: Family Medicine

## 2018-03-08 ENCOUNTER — Ambulatory Visit (INDEPENDENT_AMBULATORY_CARE_PROVIDER_SITE_OTHER): Payer: BLUE CROSS/BLUE SHIELD | Admitting: Family Medicine

## 2018-03-08 VITALS — BP 112/70 | HR 105 | Temp 98.5°F | Ht 67.0 in | Wt 208.0 lb

## 2018-03-08 DIAGNOSIS — G43809 Other migraine, not intractable, without status migrainosus: Secondary | ICD-10-CM | POA: Diagnosis not present

## 2018-03-08 DIAGNOSIS — R6882 Decreased libido: Secondary | ICD-10-CM

## 2018-03-08 DIAGNOSIS — S76012A Strain of muscle, fascia and tendon of left hip, initial encounter: Secondary | ICD-10-CM

## 2018-03-08 DIAGNOSIS — N939 Abnormal uterine and vaginal bleeding, unspecified: Secondary | ICD-10-CM | POA: Insufficient documentation

## 2018-03-08 MED ORDER — NAPROXEN 500 MG PO TABS: 500.0000 mg | ORAL_TABLET | Freq: Two times a day (BID) | ORAL | 1 refills | Status: DC | PRN

## 2018-03-08 NOTE — Progress Notes (Signed)
Subjective:    Patient ID: Angel Hernandez, female    DOB: June 09, 1971, 47 y.o.   MRN: [id number]  HPI This is a 47 yo female who presents today with "ocular migraines" x 3 months and left sided radiating groin pain for 1 week.  Headaches- off and on x years and recently more pain. Was told that she has ocular migraines. Pain over right eye, minor blurred vision. Current headaches daily x 3 months almost every day. Triggered by stress, sees "prism," goes away in about 20 minutes then pain starts. No indoor light sensitivity, unknown if sound sensitivity. No nausea or vomiting. Takes ibuprofen 400 mg with intermittent relief. Never awakens with headache, rarely has them at home. Last eye exam earlier this year- no abnormal findings. Drinks 4-5 cups regular coffee a day, 2-3 bottles of water, some increase in fluids. Some increased headaches with stress at work. Not worse with physical activity. 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 Headaches started 12 years ago when she was divorcing her first husband.   Left sided leg pain that started 5 days ago. Pain in left groin with numbness down top of leg. No unusual activity, but was at beach last week. Pain 7/10, constant, achy, throbbing. Took ibuprofen 400 mg without relief, no relief with moving/not moving. Knee aching. Pain relieved after her husband massaged her foot last night. Wears "worn down," sneakers at work, was barefooted at [employer]. No falls, no weakness.   Decreased libido. Has had hysterectomy. Still has ovaries. Very stressed.   Past Medical History:  Diagnosis Date  . GERD (gastroesophageal reflux disease)   . Headache    migraines  . Hypothyroid   . Thyroid cancer ([place]) 2001  . Tobacco abuse    Past Surgical History:  Procedure Laterality Date  . BILATERAL SALPINGECTOMY Bilateral 03/19/2017   Procedure: BILATERAL SALPINGECTOMY;  Surgeon: Ward, Honor Loh, MD;   Location: [facility] ORS;  Service: Gynecology;  Laterality: Bilateral;  . CESAREAN SECTION    . CYSTOSCOPY N/A 03/19/2017   Procedure: CYSTOSCOPY;  Surgeon: Ward, Honor Loh, MD;  Location: [facility] ORS;  Service: Gynecology;  Laterality: N/A;  . DILATION AND CURETTAGE OF UTERUS    . KNEE ARTHROSCOPY Left   . thyroidectomy  2001  . VAGINAL HYSTERECTOMY N/A 03/19/2017   Procedure: HYSTERECTOMY VAGINAL;  Surgeon: Ward, Honor Loh, MD;  Location: [facility] ORS;  Service: Gynecology;  Laterality: N/A;   Family History  Problem Relation Age of Onset  . Alzheimer's disease Father   . Cancer Father    Social History   Tobacco Use  . Smoking status: Current Every Day Smoker    Packs/day: 0.50    Types: Cigarettes  . Smokeless tobacco: Never Used  Substance Use Topics  . Alcohol use: Yes    Comment: social  . Drug use: No      Review of Systems Per HPI    Objective:   Physical Exam  Constitutional: She is oriented to person, place, and time. She appears well-developed and well-nourished. No distress.  HENT:  Head: Normocephalic and atraumatic.  Eyes: Pupils are equal, round, and reactive to light. Conjunctivae and EOM are normal.  Neck: Normal range of motion. Neck supple.  Cardiovascular: Normal rate.  Pulmonary/Chest: Effort normal.  Musculoskeletal:       Left hip: She exhibits tenderness (left gluteal). She exhibits normal range of motion and normal strength.  Lumbar back: She exhibits normal range of motion, no tenderness and no bony tenderness.  Neurological: She is alert and oriented to person, place, and time. She displays normal reflexes. No cranial nerve deficit. She exhibits normal muscle tone. Coordination normal.  Skin: Skin is warm and dry. She is not diaphoretic.  Psychiatric: She has a normal mood and affect. Her behavior is normal. Judgment and thought content normal.  Vitals reviewed.     BP 112/70 (BP Location: Left Arm, Patient Position: Sitting, Cuff Size: Normal)    Pulse (!) 105   Temp 98.5 F (36.9 C) (Oral)   Ht 5\' 7"  (1.702 m)   Wt 208 lb (94.3 kg)   LMP 03/09/2017   SpO2 98%   BMI 32.58 kg/m  Wt Readings from Last 3 Encounters:  03/08/18 208 lb (94.3 kg)  07/31/17 203 lb (92.1 kg)  03/19/17 203 lb (92.1 kg)       Assessment & Plan:  1. Other migraine without status migrainosus, not intractable - discussed potential triggers and provided written information, encouraged her to keep a headache log.  - RTC precautions reviewed - naproxen (NAPROSYN) 500 MG tablet; Take 1 tablet (500 mg total) by mouth 2 (two) times daily as needed. Take with meals.  Dispense: 30 tablet; Refill: 1  2. Hip strain, left, initial encounter - no worrisome finding on exam, encouarged her to try heat, gentle ROM, massage - naproxen (NAPROSYN) 500 MG tablet; Take 1 tablet (500 mg total) by mouth 2 (two) times daily as needed. Take with meals.  Dispense: 30 tablet; Refill: 1  3. Decreased libido - discussed likely contributing factors including stress  - follow up with PCP for CPE and labs   Clarene Reamer, FNP-BC  [facility], [place]  03/08/2018 5:26 PM

## 2018-03-08 NOTE — Patient Instructions (Signed)
Good to see you today, I have sent in medication to your pharmacy Take twice a day for 3 days for headache, left leg pain then twice a day as needed Please keep a log of your headaches- date, time, how long they last, foods you have had in last 12 hours, look for patterns/triggers Schedule follow up for annual exam with PCP, bring headache log with you Wean caffeine to 1 cup a day    Migraine Headache A migraine headache is a very strong throbbing pain on one side or both sides of your head. Migraines can also cause other symptoms. Talk with your doctor about what things may bring on (trigger) your migraine headaches. Follow these instructions at home: Medicines  Take over-the-counter and prescription medicines only as told by your doctor.  Do not drive or use heavy machinery while taking prescription pain medicine.  To prevent or treat constipation while you are taking prescription pain medicine, your doctor may recommend that you: ? Drink enough fluid to keep your pee (urine) clear or pale yellow. ? Take over-the-counter or prescription medicines. ? Eat foods that are high in fiber. These include fresh fruits and vegetables, whole grains, and beans. ? Limit foods that are high in fat and processed sugars. These include fried and sweet foods. Lifestyle  Avoid alcohol.  Do not use any products that contain nicotine or tobacco, such as cigarettes and e-cigarettes. If you need help quitting, ask your doctor.  Get at least 8 hours of sleep every night.  Limit your stress. General instructions   Keep a journal to find out what may bring on your migraines. For example, write down: ? What you eat and drink. ? How much sleep you get. ? Any change in what you eat or drink. ? Any change in your medicines.  If you have a migraine: ? Avoid things that make your symptoms worse, such as bright lights. ? It may help to lie down in a dark, quiet room. ? Do not drive or use heavy  machinery. ? Ask your doctor what activities are safe for you.  Keep all follow-up visits as told by your doctor. This is important. Contact a doctor if:  You get a migraine that is different or worse than your usual migraines. Get help right away if:  Your migraine gets very bad.  You have a fever.  You have a stiff neck.  You have trouble seeing.  Your muscles feel weak or like you cannot control them.  You start to lose your balance a lot.  You start to have trouble walking.  You pass out (faint). This information is not intended to replace advice given to you by your health care provider. Make sure you discuss any questions you have with your health care provider. Document Released: 04/25/2008 Document Revised: 02/04/2016 Document Reviewed: 01/03/2016 Elsevier Interactive Patient Education  2018 [employer].

## 2018-03-12 ENCOUNTER — Ambulatory Visit: Payer: Self-pay | Admitting: Family Medicine

## 2018-03-12 NOTE — Telephone Encounter (Signed)
Called in c/o shooting pain in left groin area that radiates down into her thigh and calf.  She is experiencing some numbness with this.   She saw Clarene Reamer, FNP on 03/08/18 for this and was prescribed Naproxen.   She has been using it without relief.    See triage notes.  The agent made her an appt with Dr. Frederico Hamman Copland for 8/15 at 2:40 for this problem.   It was agreed to wait until she sees Dr. Lorelei Pont versus trying to route a note to [employer].    I instructed her to please call us back if the pain became worse or her symptoms increased.    She verbalized understanding and was agreeable to this plan.   Reason for Disposition . [1] MODERATE leg swelling (e.g., swelling extends up to knees) AND [2] new onset or worsening    Been seen for these symptoms on 03/08/18 without improvement.   Appt made.  Answer Assessment - Initial Assessment Questions 1. ONSET: "When did the swelling start?" (e.g., minutes, hours, days)     Started hurting after returned from beach on 8/2/   I went to work on Saturday.   I had pain in my left groin area.   It would shoot down my leg.  Thigh and calf go numb.   She gave me Naprazon but it's not helping.   Doctor said it was a muscle strain. 2. LOCATION: "What part of the leg is swollen?"  "Are both legs swollen or just one leg?"     My thigh is mostly swollen.    I've been on my feet all day today.   My calf is tight and ankle too. 3. SEVERITY: "How bad is the swelling?" (e.g., localized; mild, moderate, severe)  - Localized - small area of swelling localized to one leg  - MILD pedal edema - swelling limited to foot and ankle, pitting edema < 1/4 inch (6 mm) deep, rest and elevation eliminate most or all swelling  - MODERATE edema - swelling of lower leg to knee, pitting edema > 1/4 inch (6 mm) deep, rest and elevation only partially reduce swelling  - SEVERE edema - swelling extends above knee, facial or hand swelling present      My left leg is bigger  than my right.    There is one spot that is tender near my knee joint on the back of my leg. 4. REDNESS: "Does the swelling look red or infected?"     No redness. 5. PAIN: "Is the swelling painful to touch?" If so, ask: "How painful is it?"   (Scale 1-10; mild, moderate or severe)     No pain with touch. 6. FEVER: "Do you have a fever?" If so, ask: "What is it, how was it measured, and when did it start?"      No fever 7. CAUSE: "What do you think is causing the leg swelling?"     I have no idea.    I haven't done anything to injure it. 8. MEDICAL HISTORY: "Do you have a history of heart failure, kidney disease, liver failure, or cancer?"     No. 9. 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  I'm very tired and it's getting harder to get around store due to fatigue.  Just ankles slightly after being on my feet all day. 10. OTHER SYMPTOMS: "Do you have any other symptoms?" (e.g., chest pain, difficulty breathing)       No  11. PREGNANCY: "Is there any chance you are pregnant?" "When was your last menstrual period?"       Hysterectomy  Protocols used: LEG SWELLING AND EDEMA-A-AH

## 2018-03-14 ENCOUNTER — Ambulatory Visit (INDEPENDENT_AMBULATORY_CARE_PROVIDER_SITE_OTHER): Payer: BLUE CROSS/BLUE SHIELD | Admitting: Family Medicine

## 2018-03-14 ENCOUNTER — Encounter: Payer: Self-pay | Admitting: Family Medicine

## 2018-03-14 ENCOUNTER — Ambulatory Visit (INDEPENDENT_AMBULATORY_CARE_PROVIDER_SITE_OTHER)
Admission: RE | Admit: 2018-03-14 | Discharge: 2018-03-14 | Disposition: A | Discharge status: Home / Self Care | Payer: BLUE CROSS/BLUE SHIELD | Source: Ambulatory Visit | Attending: Family Medicine | Admitting: Family Medicine

## 2018-03-14 VITALS — BP 140/86 | HR 77 | Temp 98.7°F | Ht 67.0 in | Wt 210.5 lb

## 2018-03-14 DIAGNOSIS — M5416 Radiculopathy, lumbar region: Secondary | ICD-10-CM | POA: Diagnosis not present

## 2018-03-14 DIAGNOSIS — M4726 Other spondylosis with radiculopathy, lumbar region: Secondary | ICD-10-CM | POA: Diagnosis not present

## 2018-03-14 MED ORDER — PREDNISONE 20 MG PO TABS: ORAL_TABLET | ORAL | 0 refills | Status: DC

## 2018-03-14 NOTE — Progress Notes (Signed)
Dr. Frederico Hamman T. Jceon Alverio, MD, [facility] [place], [address] Phone: [phone] Fax: [phone]  03/14/2018  Patient: Angel Hernandez, MRN: [id number], DOB: January 04, 1971, 47 y.o.  Primary Physician:  Owens Loffler, MD   Chief Complaint  Patient presents with  . Leg Pain    Left-with numbness  . Knot on Left Leg    Tender to touch   Subjective:   Angel Hernandez is a 47 y.o. very pleasant female patient who presents with the following:  Went to the beach and pain going down the L leg and running down her L leg. She was seen 45 week ago by Mrs. Carlean Purl and given some naprosyn.   She is having some intermittent pain going down her left leg which is different from her baseline.  She has had some issues with her back off and on in the past.  She has some occasional tingling in neuropathic pain symptoms going down the left leg.  She is not having any weakness that she can appreciate.  She is having a focal area tenderness on the medial aspect of the distal thigh  Also had a migraine yesterday.   Starrted moving around over the last  Medial "knot" on the medial aspect of her distal thigh.  Anterior shin on the L Bottom of foot on the L  Lumbar radiculopathy on the L  Past Medical History, Surgical History, Social History, Family History, Problem List, Medications, and Allergies have been reviewed and updated if relevant.  Patient Active Problem List   Diagnosis Date Noted  . Abnormal uterine bleeding (AUB) 03/08/2018  . S/P hysterectomy 03/19/2017  . Irregular menses 06/04/2014  . Thoracic back pain 06/04/2014  . Fatigue 06/04/2014  . 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 Hypothyroid   . Tobacco abuse   . Thyroid cancer [facility])     Past Medical History:  Diagnosis Date  . GERD (gastroesophageal reflux disease)   . Headache    migraines  . Hypothyroid   . Thyroid cancer ([place]) 2001  . Tobacco abuse     Past  Surgical History:  Procedure Laterality Date  . BILATERAL SALPINGECTOMY Bilateral 03/19/2017   Procedure: BILATERAL SALPINGECTOMY;  Surgeon: Ward, Honor Loh, MD;  Location: [facility] ORS;  Service: Gynecology;  Laterality: Bilateral;  . CESAREAN SECTION    . CYSTOSCOPY N/A 03/19/2017   Procedure: CYSTOSCOPY;  Surgeon: Ward, Honor Loh, MD;  Location: [facility] ORS;  Service: Gynecology;  Laterality: N/A;  . DILATION AND CURETTAGE OF UTERUS    . KNEE ARTHROSCOPY Left   . thyroidectomy  2001  . VAGINAL HYSTERECTOMY N/A 03/19/2017   Procedure: HYSTERECTOMY VAGINAL;  Surgeon: Ward, Honor Loh, MD;  Location: [facility] ORS;  Service: Gynecology;  Laterality: N/A;    Social History   Socioeconomic History  . Marital status: Married    Spouse name: Not on file  . Number of children: Not on file  . Years of education: Not on file  . Highest education level: Not on file  Occupational History  . 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 Financial resource strain: Not on file  . Food insecurity:    Worry: Not on file    Inability: Not on file  . Transportation needs:    Medical: Not on file    Non-medical: Not on file  Tobacco Use  . Smoking  status: Current Every Day Smoker    Packs/day: 0.50    Types: Cigarettes  . Smokeless tobacco: Never Used  Substance and Sexual Activity  . Alcohol use: Yes    Comment: social  . Drug use: No  . Sexual activity: Yes    Partners: Male  Lifestyle  . Physical activity:    Days per week: Not on file    Minutes per session: Not on file  . Stress: Not on file  Relationships  . Social connections:    Talks on phone: Not on file    Gets together: Not on file    Attends religious service: Not on file    Active member of club or organization: Not on file    Attends meetings of clubs or organizations: Not on file    Relationship status: Not on file  . Intimate partner violence:    Fear of current or ex  partner: Not on file    Emotionally abused: Not on file    Physically abused: Not on file    Forced sexual activity: Not on file  Other Topics Concern  . Not on file  Social History Narrative   4 children - 20, 9, 55, 57.   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 Alzheimer's disease Father   . Cancer Father     No Known Allergies  Medication list reviewed and updated in full in [place].  GEN: No fevers, chills. Nontoxic. Primarily MSK c/o today. MSK: Detailed in the HPI GI: tolerating PO intake without difficulty Neuro: some numbness on L LE Otherwise the pertinent positives of the ROS are noted above.   Objective:   BP 140/86   Pulse 77   Temp 98.7 F (37.1 C) (Oral)   Ht 5\' 7"  (1.702 m)   Wt 210 lb 8 oz (95.5 kg)   LMP 03/09/2017   BMI 32.97 kg/m    GEN: Well-developed,well-nourished,in no acute distress; alert,appropriate and cooperative throughout examination HEENT: Normocephalic and atraumatic without obvious abnormalities. Ears, externally no deformities PULM: Breathing comfortably in no respiratory distress EXT: No clubbing, cyanosis, or edema PSYCH: Normally interactive. Cooperative during the interview. Pleasant. Friendly and conversant. Not anxious or depressed appearing. Normal, full affect.  Range of motion at  the waist: Flexion, extension, lateral bending and rotation: Modest restriction of motion in forward flexion and to a lesser extent with lateral bending and rotational movements.  Extension is preserved.  No echymosis or edema Rises to examination table with mild difficulty Gait: minimally antalgic  Inspection/Deformity: N Paraspinus Tenderness: Moderate tenderness from L3-S1 bilaterally.  B Ankle Dorsiflexion (L5,4): 5/5 B Great Toe Dorsiflexion (L5,4): 5/5 Heel Walk (L5): WNL Toe Walk (S1): WNL Rise/Squat (L4): WNL, mild  pain  SENSORY B Medial Foot (L4): WNL B Dorsum (L5): decreased on bottom B Lateral (S1): WNL Light Touch: anterior shin decreased Pinprick: anterior shin decreased  REFLEXES Knee (L4): 2+ Ankle (S1): 2+  B SLR, seated: neg B SLR, supine: neg B FABER: neg B Reverse FABER: neg B Greater Troch: NT B Log Roll: neg B Stork: NT B Sciatic Notch: NT   Radiology: Dg Lumbar Spine Complete  Result Date: 03/15/2018 CLINICAL DATA:  Left lumbar radiculopathy. EXAM: LUMBAR SPINE - COMPLETE 4+ VIEW COMPARISON:  Thoracic spine 06/04/2014. FINDINGS: Soft tissue structures are unremarkable. Thoracolumbar spine scoliosis. Diffuse multilevel degenerative change.  Mild T11 and T12 compression fractures, unchanged from prior exam. IMPRESSION: Mild T11 and T12 compression fractures, unchanged from prior study of 06/04/2014. Thoracolumbar spine scoliosis. Diffuse degenerative change. Electronically Signed   By: Marcello Moores  Register   On: 03/15/2018 06:50    Assessment and Plan:   Left lumbar radiculopathy - Plan: DG Lumbar Spine Complete  No emergent findings on plain films.  She does have both radiculopathy, as well as some sensory decrease in her left-sided lower extremity, which would indicate nerve origin, most likely discogenic in this patient group.  I meant to give her some prednisone for 10 days, and if symptoms persist, then additional work-up and treatment would be warranted.  Meds ordered this encounter  Medications  . predniSONE (DELTASONE) 20 MG tablet    Sig: 2 tabs po for 5 days, then 1 tab po for 5 days    Dispense:  15 tablet    Refill:  0   Orders Placed This Encounter  Procedures  . DG Lumbar Spine Complete    Signed,  Castella Lerner T. Soma Bachand, MD   Allergies as of 03/14/2018   No Known Allergies     Medication List        Accurate as of 03/14/18 11:59 PM. Always use your most recent med list.          ibuprofen 200 MG tablet Commonly known as:  ADVIL,MOTRIN Take 400  mg by mouth every 6 (six) hours as needed.   levothyroxine 112 MCG tablet Commonly known as:  SYNTHROID, LEVOTHROID TAKE 1 TABLET BY MOUTH DAILY   naproxen 500 MG tablet Commonly known as:  NAPROSYN Take 1 tablet (500 mg total) by mouth 2 (two) times daily as needed. Take with meals.   predniSONE 20 MG tablet Commonly known as:  DELTASONE 2 tabs po for 5 days, then 1 tab po for 5 days

## 2018-05-14 DIAGNOSIS — Z85850 Personal history of malignant neoplasm of thyroid: Secondary | ICD-10-CM | POA: Diagnosis not present

## 2018-05-14 DIAGNOSIS — C73 Malignant neoplasm of thyroid gland: Secondary | ICD-10-CM | POA: Diagnosis not present

## 2018-05-14 DIAGNOSIS — Z23 Encounter for immunization: Secondary | ICD-10-CM | POA: Diagnosis not present

## 2018-05-14 DIAGNOSIS — E890 Postprocedural hypothyroidism: Secondary | ICD-10-CM | POA: Diagnosis not present

## 2018-05-30 DIAGNOSIS — F329 Major depressive disorder, single episode, unspecified: Secondary | ICD-10-CM | POA: Diagnosis not present

## 2018-05-30 DIAGNOSIS — F411 Generalized anxiety disorder: Secondary | ICD-10-CM | POA: Diagnosis not present

## 2018-07-05 ENCOUNTER — Emergency Department: Payer: BLUE CROSS/BLUE SHIELD

## 2018-07-05 ENCOUNTER — Other Ambulatory Visit: Payer: Self-pay

## 2018-07-05 ENCOUNTER — Emergency Department
Admission: EM | Admit: 2018-07-05 | Discharge: 2018-07-05 | Disposition: A | Discharge status: Home / Self Care | Payer: BLUE CROSS/BLUE SHIELD | Attending: Student in an Organized Health Care Education/Training Program | Admitting: Student in an Organized Health Care Education/Training Program

## 2018-07-05 ENCOUNTER — Encounter: Payer: Self-pay | Admitting: Emergency Medicine

## 2018-07-05 DIAGNOSIS — R091 Pleurisy: Secondary | ICD-10-CM | POA: Insufficient documentation

## 2018-07-05 DIAGNOSIS — Z85850 Personal history of malignant neoplasm of thyroid: Secondary | ICD-10-CM | POA: Insufficient documentation

## 2018-07-05 DIAGNOSIS — E039 Hypothyroidism, unspecified: Secondary | ICD-10-CM | POA: Diagnosis not present

## 2018-07-05 DIAGNOSIS — R0789 Other chest pain: Secondary | ICD-10-CM | POA: Diagnosis not present

## 2018-07-05 DIAGNOSIS — Z79899 Other long term (current) drug therapy: Secondary | ICD-10-CM | POA: Diagnosis not present

## 2018-07-05 DIAGNOSIS — R05 Cough: Secondary | ICD-10-CM | POA: Diagnosis not present

## 2018-07-05 DIAGNOSIS — F17210 Nicotine dependence, cigarettes, uncomplicated: Secondary | ICD-10-CM | POA: Diagnosis not present

## 2018-07-05 LAB — CBC
HCT: 42.7 % (ref 36.0–46.0)
Hemoglobin: 14.4 g/dL (ref 12.0–15.0)
MCH: 31.6 pg (ref 26.0–34.0)
MCHC: 33.7 g/dL (ref 30.0–36.0)
MCV: 93.6 fL (ref 80.0–100.0)
Platelets: 340 10*3/uL (ref 150–400)
RBC: 4.56 MIL/uL (ref 3.87–5.11)
RDW: 11.9 % (ref 11.5–15.5)
WBC: 11.9 10*3/uL — ABNORMAL HIGH (ref 4.0–10.5)
nRBC: 0 % (ref 0.0–0.2)

## 2018-07-05 LAB — BASIC METABOLIC PANEL
Anion gap: 8 (ref 5–15)
BUN: 9 mg/dL (ref 6–20)
CO2: 26 mmol/L (ref 22–32)
Calcium: 9.1 mg/dL (ref 8.9–10.3)
Chloride: 104 mmol/L (ref 98–111)
Creatinine, Ser: 0.9 mg/dL (ref 0.44–1.00)
GFR calc Af Amer: >60 mL/min (ref >60)
GFR calc non Af Amer: >60 mL/min (ref >60)
Glucose, Bld: 99 mg/dL (ref 70–99)
Potassium: 3.8 mmol/L (ref 3.5–5.1)
Sodium: 138 mmol/L (ref 135–145)

## 2018-07-05 LAB — TROPONIN I
Troponin I: <0.03 ng/mL (ref <0.03)
Troponin I: <0.03 ng/mL (ref <0.03)

## 2018-07-05 LAB — FIBRIN DERIVATIVES D-DIMER (ARMC ONLY): Fibrin derivatives D-dimer (ARMC): 605.38 ng/mL (FEU) — ABNORMAL HIGH (ref 0.00–499.00)

## 2018-07-05 MED ORDER — IOHEXOL 350 MG/ML SOLN
75.0000 mL | Freq: Once | INTRAVENOUS | Status: AC | PRN
  Administered 2018-07-05: 75 mL via INTRAVENOUS
  Filled 2018-07-05: qty 75

## 2018-07-05 MED ORDER — IPRATROPIUM-ALBUTEROL 0.5-2.5 (3) MG/3ML IN SOLN
3.0000 mL | Freq: Once | RESPIRATORY_TRACT | Status: AC
  Administered 2018-07-05: 3 mL via RESPIRATORY_TRACT
  Filled 2018-07-05: qty 3

## 2018-07-05 MED ORDER — PREDNISONE 20 MG PO TABS
60.0000 mg | ORAL_TABLET | Freq: Once | ORAL | Status: AC
  Administered 2018-07-05: 60 mg via ORAL
  Filled 2018-07-05: qty 3

## 2018-07-05 MED ORDER — PREDNISONE 20 MG PO TABS: 40.0000 mg | ORAL_TABLET | Freq: Every day | ORAL | 0 refills | Status: AC

## 2018-07-05 MED ORDER — LIDOCAINE 5 % EX PTCH
1.0000 | MEDICATED_PATCH | CUTANEOUS | Status: DC
  Administered 2018-07-05: 1 via TRANSDERMAL
  Filled 2018-07-05: qty 1

## 2018-07-05 MED ORDER — SODIUM CHLORIDE 0.9 % IV BOLUS
500.0000 mL | Freq: Once | INTRAVENOUS | Status: AC
  Administered 2018-07-05: 500 mL via INTRAVENOUS

## 2018-07-05 MED ORDER — HYDROCODONE-ACETAMINOPHEN 5-325 MG PO TABS: 1.0000 | ORAL_TABLET | ORAL | 0 refills | Status: DC | PRN

## 2018-07-05 MED ORDER — HYDROCODONE-ACETAMINOPHEN 5-325 MG PO TABS
1.0000 | ORAL_TABLET | Freq: Once | ORAL | Status: AC
  Administered 2018-07-05: 1 via ORAL
  Filled 2018-07-05: qty 1

## 2018-07-05 MED ORDER — NAPROXEN 500 MG PO TABS
500.0000 mg | ORAL_TABLET | Freq: Once | ORAL | Status: AC
  Administered 2018-07-05: 500 mg via ORAL
  Filled 2018-07-05: qty 1

## 2018-07-05 MED ORDER — ALBUTEROL SULFATE HFA 108 (90 BASE) MCG/ACT IN AERS: 2.0000 | INHALATION_SPRAY | Freq: Four times a day (QID) | RESPIRATORY_TRACT | 2 refills | Status: DC | PRN

## 2018-07-05 MED ORDER — TRAMADOL HCL 50 MG PO TABS
100.0000 mg | ORAL_TABLET | Freq: Once | ORAL | Status: AC
  Administered 2018-07-05: 100 mg via ORAL
  Filled 2018-07-05: qty 2

## 2018-07-05 MED ORDER — LIDOCAINE 5 % EX PTCH: 1.0000 | MEDICATED_PATCH | Freq: Two times a day (BID) | CUTANEOUS | 0 refills | Status: DC

## 2018-07-05 NOTE — ED Triage Notes (Signed)
Pt to ED via POV c/o left sided chest pain under her breast, left sided neck pain, and left arm pain. Pt states that the pain started yesterday afternoon. Pt denies any other symptoms at this time. Pt is in NAD.

## 2018-07-05 NOTE — Discharge Instructions (Addendum)
Please take steroids and albuterol inhaler as prescribed.  Return to the ER if you have any high fevers, worsening shortness of breath, worsening chest pain.  Follow-up with your PCP.  Return for any additional questions or concerns.

## 2018-07-05 NOTE — ED Provider Notes (Signed)
[facility] Emergency Department Provider Note    First MD Initiated Contact with Patient 07/05/18 1902     (approximate)  I have reviewed the triage vital signs and the nursing notes.   HISTORY  Chief Complaint Chest Pain    HPI Angel Hernandez is a 47 y.o. female past listed medical history smoking 1 pack gets daily presents with 3 weeks of nonproductive cough and 2 days of worsening left-sided chest pain.  States the pain is worse when she coughs and takes a deeper breath.  Denies any fevers or chills at home.  Is not been on any antibiotics or steroids recently.  States that she is also had some tingling in her left arm.  No pain radiating through to her back.  Has never had symptoms like this before.  Denies any trauma.  Or vomiting.    Past Medical History:  Diagnosis Date  . GERD (gastroesophageal reflux disease)   . Headache    migraines  . Hypothyroid   . Thyroid cancer ([place]) 2001  . Tobacco abuse    Family History  Problem Relation Age of Onset  . Alzheimer's disease Father   . Cancer Father    Past Surgical History:  Procedure Laterality Date  . BILATERAL SALPINGECTOMY Bilateral 03/19/2017   Procedure: BILATERAL SALPINGECTOMY;  Surgeon: Ward, Honor Loh, MD;  Location: [facility] ORS;  Service: Gynecology;  Laterality: Bilateral;  . CESAREAN SECTION    . CYSTOSCOPY N/A 03/19/2017   Procedure: CYSTOSCOPY;  Surgeon: Ward, Honor Loh, MD;  Location: [facility] ORS;  Service: Gynecology;  Laterality: N/A;  . DILATION AND CURETTAGE OF UTERUS    . KNEE ARTHROSCOPY Left   . thyroidectomy  2001  . VAGINAL HYSTERECTOMY N/A 03/19/2017   Procedure: HYSTERECTOMY VAGINAL;  Surgeon: Ward, Honor Loh, MD;  Location: [facility] ORS;  Service: Gynecology;  Laterality: N/A;   Patient Active Problem List   Diagnosis Date Noted  . Abnormal uterine bleeding (AUB) 03/08/2018  . S/P hysterectomy 03/19/2017  . Irregular menses 06/04/2014  . Thoracic back pain 06/04/2014  .  Fatigue 06/04/2014  . 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 Hypothyroid   . Tobacco abuse   . Thyroid cancer ([place])       Prior to Admission medications   Medication Sig Start Date End Date Taking? Authorizing Provider  albuterol (PROVENTIL HFA;VENTOLIN HFA) 108 (90 Base) MCG/ACT inhaler Inhale 2 puffs into the lungs every 6 (six) hours as needed for wheezing or shortness of breath. 07/05/18   Merlyn Lot, MD  HYDROcodone-acetaminophen (NORCO) 5-325 MG tablet Take 1 tablet by mouth every 4 (four) hours as needed for moderate pain. 07/05/18   Merlyn Lot, MD  ibuprofen (ADVIL,MOTRIN) 200 MG tablet Take 400 mg by mouth every 6 (six) hours as needed.    [provider]  levothyroxine (SYNTHROID, LEVOTHROID) 112 MCG tablet TAKE 1 TABLET BY MOUTH DAILY 11/21/16   Copland, Frederico Hamman, MD  lidocaine (LIDODERM) 5 % Place 1 patch onto the skin every 12 (twelve) hours. Remove & Discard patch within 12 hours or as directed by MD 07/05/18 07/05/19  Merlyn Lot, MD  naproxen (NAPROSYN) 500 MG tablet Take 1 tablet (500 mg total) by mouth 2 (two) times daily as needed. Take with meals. 03/08/18   Elby Beck, FNP  predniSONE (DELTASONE) 20 MG tablet 2 tabs po for 5 days, then 1 tab po for 5 days 03/14/18   Copland, Frederico Hamman, MD  predniSONE (DELTASONE) 20 MG tablet Take 2  tablets (40 mg total) by mouth daily for 5 days. 07/05/18 07/10/18  Merlyn Lot, MD    Allergies Patient has no known allergies.    Social History Social History   Tobacco Use  . Smoking status: Current Every Day Smoker    Packs/day: 0.50    Types: Cigarettes  . Smokeless tobacco: Never Used  Substance Use Topics  . Alcohol use: Yes    Comment: social  . Drug use: No    Review of Systems Patient denies headaches, rhinorrhea, blurry vision, numbness, shortness of breath, chest pain, edema, cough, abdominal pain, nausea, vomiting, diarrhea, dysuria, fevers, rashes or hallucinations unless otherwise  stated above in HPI. ____________________________________________   PHYSICAL EXAM:  VITAL SIGNS: Vitals:   07/05/18 1923 07/05/18 2225  BP: 131/73 116/64  Pulse: 71 68  Resp: 17 16  Temp: 98.1 F (36.7 C)   SpO2: 98% 98%    Constitutional: Alert and oriented.  Eyes: Conjunctivae are normal.  Head: Atraumatic. Nose: No congestion/rhinnorhea. Mouth/Throat: Mucous membranes are moist.   Neck: No stridor. Painless ROM.  Cardiovascular: Normal rate, regular rhythm. Grossly normal heart sounds.  Good peripheral circulation. Respiratory: Normal respiratory effort.  No retractions. Lungs with coarse bibasilar breathsounds Gastrointestinal: Soft and nontender. No distention. No abdominal bruits. No CVA tenderness. Genitourinary:  Musculoskeletal: No lower extremity tenderness nor edema.  No joint effusions. Neurologic:  Normal speech and language. No gross focal neurologic deficits are appreciated. 5/5 strneght in BUE No facial droop Skin:  Skin is warm, dry and intact. No rash noted. Psychiatric: Mood and affect are normal. Speech and behavior are normal.  ____________________________________________   LABS (all labs ordered are listed, but only abnormal results are displayed)  Results for orders placed or performed during the hospital encounter of 07/05/18 (from the past 24 hour(s))  Basic metabolic panel     Status: None   Collection Time: 07/05/18  5:53 PM  Result Value Ref Range   Sodium 138 135 - 145 mmol/L   Potassium 3.8 3.5 - 5.1 mmol/L   Chloride 104 98 - 111 mmol/L   CO2 26 22 - 32 mmol/L   Glucose, Bld 99 70 - 99 mg/dL   BUN 9 6 - 20 mg/dL   Creatinine, Ser 0.90 0.44 - 1.00 mg/dL   Calcium 9.1 8.9 - 10.3 mg/dL   GFR calc non Af Amer >60 >60 mL/min   GFR calc Af Amer >60 >60 mL/min   Anion gap 8 5 - 15  CBC     Status: Abnormal   Collection Time: 07/05/18  5:53 PM  Result Value Ref Range   WBC 11.9 (H) 4.0 - 10.5 K/uL   RBC 4.56 3.87 - 5.11 MIL/uL    Hemoglobin 14.4 12.0 - 15.0 g/dL   HCT 42.7 36.0 - 46.0 %   MCV 93.6 80.0 - 100.0 fL   MCH 31.6 26.0 - 34.0 pg   MCHC 33.7 30.0 - 36.0 g/dL   RDW 11.9 11.5 - 15.5 %   Platelets 340 150 - 400 K/uL   nRBC 0.0 0.0 - 0.2 %  Troponin I - ONCE - STAT     Status: None   Collection Time: 07/05/18  5:53 PM  Result Value Ref Range   Troponin I <0.03 <0.03 ng/mL  Fibrin derivatives D-Dimer ([facility] only)     Status: Abnormal   Collection Time: 07/05/18  5:53 PM  Result Value Ref Range   Fibrin derivatives D-dimer ([facility]) 605.38 (H) 0.00 - 499.00 ng/mL (  FEU)  Troponin I - Once-Timed     Status: None   Collection Time: 07/05/18  8:57 PM  Result Value Ref Range   Troponin I <0.03 <0.03 ng/mL   ____________________________________________  EKG My review and personal interpretation at Time: 17:51   Indication: chest pain  Rate: 80  Rhythm: sinus Axis: normal Other: normal intervals, no stemi ____________________________________________  RADIOLOGY  I personally reviewed all radiographic images ordered to evaluate for the above acute complaints and reviewed radiology reports and findings.  These findings were personally discussed with the patient.  Please see medical record for radiology report.  ____________________________________________   PROCEDURES  Procedure(s) performed:  Procedures    Critical Care performed: no ____________________________________________   INITIAL IMPRESSION / ASSESSMENT AND PLAN / ED COURSE  Pertinent labs & imaging results that were available during my care of the patient were reviewed by me and considered in my medical decision making (see chart for details).   DDX: ACS, pericarditis, esophagitis, boerhaaves, pe, dissection, pna, bronchitis, costochondritis   Angel Hernandez is a 47 y.o. who presents to the ED with chest pain as described above.  Patient nontoxic-appearing.  Symptoms as described above.  Does have heavy smoking history.  EKG without any  evidence of acute ischemia initial troponin is negative.  Low risk by heart score but will order serial enzymes to further risk stratify.  No evidence of pneumothorax or effusion on chest x-ray.  No trauma to suggest fracture.  No evidence of shingles.  Given pleuritic chest pain discomfort will send d-dimer to further risk stratify.  Will give nebulizer treatment  Clinical Course as of Jul 05 2238  Fri Jul 05, 2018  2022 D-dimer is elevated therefore will further stratify for PE with CT angiogram.   [PR]  2203 CTA is reassuring.  Pain improved.  Patient did have significant response after bronchodilators therefore do suspect some bronchitis and pleurisy.  Discussed conservative management.  Given her cough and pain with coughing I do think that a small course of low-dose narcotics is reasonable as she did have some improvement with the Vicodin.  No evidence of infectious process or pneumonia.  We discussed importance of cessation of smoking.  Have discussed with the patient and available family all diagnostics and treatments performed thus far and all questions were answered to the best of my ability. The patient demonstrates understanding and agreement with plan.    [PR]    Clinical Course User Index [PR] Merlyn Lot, MD     As part of my medical decision making, I reviewed the following data within the [employer] notes reviewed and incorporated, Labs reviewed, notes from prior ED visits.   ____________________________________________   FINAL CLINICAL IMPRESSION(S) / ED DIAGNOSES  Final diagnoses:  Pleurisy  Atypical chest pain      NEW MEDICATIONS STARTED DURING THIS VISIT:  New Prescriptions   ALBUTEROL (PROVENTIL HFA;VENTOLIN HFA) 108 (90 BASE) MCG/ACT INHALER    Inhale 2 puffs into the lungs every 6 (six) hours as needed for wheezing or shortness of breath.   HYDROCODONE-ACETAMINOPHEN (NORCO) 5-325 MG TABLET    Take 1 tablet by mouth every 4  (four) hours as needed for moderate pain.   LIDOCAINE (LIDODERM) 5 %    Place 1 patch onto the skin every 12 (twelve) hours. Remove & Discard patch within 12 hours or as directed by MD   PREDNISONE (DELTASONE) 20 MG TABLET    Take 2 tablets (40 mg total)  by mouth daily for 5 days.     Note:  This document was prepared using Dragon voice recognition software and may include unintentional dictation errors.    Merlyn Lot, MD 07/05/18 2239

## 2018-07-05 NOTE — ED Notes (Signed)
Patient c/o nonproductive cough X 3 weeks. Patient c/o left chest/rib pain radiating to left arm, neck and back. Patient is supporting left ribs with right arm. Patient reports pain is worse with deep inspiration or movement. Patient reports accompanying symptoms of SOB, Nausea, lightheadedness and weakness.

## 2018-07-08 ENCOUNTER — Telehealth: Payer: Self-pay | Admitting: *Deleted

## 2018-07-08 NOTE — Telephone Encounter (Signed)
Patient called back and front office scheduled follow-up visit on 07/18/18 with Dr. Lorelei Pont.

## 2018-07-08 NOTE — Telephone Encounter (Signed)
Tried to call patient to schedule an ER follow-up visit. Unable to leave message because voicemail has not yet been set up.

## 2018-07-10 NOTE — Telephone Encounter (Signed)
Spoke to patient and was advised that she is some better, but is real tired and still having some difficulty breathing. Patient is still using her inhaler.  Patient scheduled to see Dr. Diona Browner tomorrow 07/11/18. Advised patient that if she gets worse before her appointment to go back to the ER and she verbalized understanding.

## 2018-07-10 NOTE — Telephone Encounter (Signed)
Pt called office back stating she was prescribed a steroid and inhaler. She just finished the steroid pills today. It was 2 pills a day for 5 days. Pt says she is still feeling bad. She is having body aches and still struggling with breathing. Pt is not sure if she can wait until the 12/19. Please advise

## 2018-07-11 ENCOUNTER — Ambulatory Visit: Payer: BLUE CROSS/BLUE SHIELD | Admitting: Family Medicine

## 2018-07-11 NOTE — Telephone Encounter (Signed)
Noted  

## 2018-07-18 ENCOUNTER — Ambulatory Visit: Payer: BLUE CROSS/BLUE SHIELD | Admitting: Family Medicine

## 2018-08-04 NOTE — Progress Notes (Deleted)
Dr. Frederico Hamman T. Lorisa Scheid, MD, [facility] [place], [address] Phone: [phone] Fax: [phone]  08/05/2018  Patient: Angel Hernandez, MRN: [id number], DOB: 1970-12-18, 48 y.o.  Primary Physician:  Owens Loffler, MD   No chief complaint on file.  Subjective:   Angel Hernandez is a 48 y.o. very pleasant female patient who presents with the following:  [place] is here for a hospital follow-up after chest pain, negative work-up in the ER, negative enzymes, negative CTA.  Past Medical History, Surgical History, Social History, Family History, Problem List, Medications, and Allergies have been reviewed and updated if relevant.  Patient Active Problem List   Diagnosis Date Noted  . Abnormal uterine bleeding (AUB) 03/08/2018  . S/P hysterectomy 03/19/2017  . Irregular menses 06/04/2014  . Thoracic back pain 06/04/2014  . 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 Hypothyroid   . Tobacco abuse   . Thyroid cancer [facility])     Past Medical History:  Diagnosis Date  . GERD (gastroesophageal reflux disease)   . Headache    migraines  . Hypothyroid   . Thyroid cancer ([place]) 2001  . Tobacco abuse     Past Surgical History:  Procedure Laterality Date  . BILATERAL SALPINGECTOMY Bilateral 03/19/2017   Procedure: BILATERAL SALPINGECTOMY;  Surgeon: Ward, Honor Loh, MD;  Location: [facility] ORS;  Service: Gynecology;  Laterality: Bilateral;  . CESAREAN SECTION    . CYSTOSCOPY N/A 03/19/2017   Procedure: CYSTOSCOPY;  Surgeon: Ward, Honor Loh, MD;  Location: [facility] ORS;  Service: Gynecology;  Laterality: N/A;  . DILATION AND CURETTAGE OF UTERUS    . KNEE ARTHROSCOPY Left   . thyroidectomy  2001  . VAGINAL HYSTERECTOMY N/A 03/19/2017   Procedure: HYSTERECTOMY VAGINAL;  Surgeon: Ward, Honor Loh, MD;  Location: [facility] ORS;  Service: Gynecology;  Laterality: N/A;    Social History   Socioeconomic History  . Marital status: Married    Spouse  name: Not on file  . Number of children: Not on file  . Years of education: Not on file  . Highest education level: Not on file  Occupational History  . Occupation: Best boy: [employer]    Comment: Aeronautical engineer in [place]  . Financial resource strain: Not on file  . Food insecurity:    Worry: Not on file    Inability: Not on file  . Transportation needs:    Medical: Not on file    Non-medical: Not on file  Tobacco Use  . Smoking status: Current Every Day Smoker    Packs/day: 0.50    Types: Cigarettes  . Smokeless tobacco: Never Used  Substance and Sexual Activity  . Alcohol use: Yes    Comment: social  . Drug use: No  . Sexual activity: Yes    Partners: Male  Lifestyle  . Physical activity:    Days per week: Not on file    Minutes per session: Not on file  . Stress: Not on file  Relationships  . Social connections:    Talks on phone: Not on file    Gets together: Not on file    Attends religious service: Not on file    Active member of club or organization: Not on file    Attends meetings of clubs or organizations: Not on file    Relationship status: Not on file  . Intimate partner violence:    Fear of current or  ex partner: Not on file    Emotionally abused: Not on file    Physically abused: Not on file    Forced sexual activity: Not on file  Other Topics Concern  . Not on file  Social History Narrative   4 children - 20, 50, 49, 3.   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 Alzheimer's disease Father   . Cancer Father     No Known Allergies  Medication list reviewed and updated in full in [place].   GEN: No acute illnesses, no fevers, chills. GI: No n/v/d, eating normally Pulm: No SOB Interactive and getting along well at home.  Otherwise, ROS is as per the HPI.  Objective:   LMP 03/09/2017   GEN: WDWN, NAD,  Non-toxic, A & O x 3 HEENT: Atraumatic, Normocephalic. Neck supple. No masses, No LAD. Ears and Nose: No external deformity. CV: RRR, No M/G/R. No JVD. No thrill. No extra heart sounds. PULM: CTA B, no wheezes, crackles, rhonchi. No retractions. No resp. distress. No accessory muscle use. EXTR: No c/c/e NEURO Normal gait.  PSYCH: Normally interactive. Conversant. Not depressed or anxious appearing.  Calm demeanor.   Laboratory and Imaging Data:  Assessment and Plan:   ***

## 2018-08-05 ENCOUNTER — Encounter: Payer: Self-pay | Admitting: *Deleted

## 2018-08-05 ENCOUNTER — Ambulatory Visit: Payer: BLUE CROSS/BLUE SHIELD | Admitting: Family Medicine

## 2018-08-05 DIAGNOSIS — Z0289 Encounter for other administrative examinations: Secondary | ICD-10-CM

## 2018-10-10 DIAGNOSIS — M79672 Pain in left foot: Secondary | ICD-10-CM | POA: Diagnosis not present

## 2018-10-10 DIAGNOSIS — D2372 Other benign neoplasm of skin of left lower limb, including hip: Secondary | ICD-10-CM | POA: Diagnosis not present

## 2018-10-10 DIAGNOSIS — D2371 Other benign neoplasm of skin of right lower limb, including hip: Secondary | ICD-10-CM | POA: Diagnosis not present

## 2018-10-10 DIAGNOSIS — B070 Plantar wart: Secondary | ICD-10-CM | POA: Diagnosis not present

## 2018-10-10 DIAGNOSIS — M79671 Pain in right foot: Secondary | ICD-10-CM | POA: Diagnosis not present

## 2018-10-24 DIAGNOSIS — D2371 Other benign neoplasm of skin of right lower limb, including hip: Secondary | ICD-10-CM | POA: Diagnosis not present

## 2018-10-24 DIAGNOSIS — M79671 Pain in right foot: Secondary | ICD-10-CM | POA: Diagnosis not present

## 2018-10-24 DIAGNOSIS — M79672 Pain in left foot: Secondary | ICD-10-CM | POA: Diagnosis not present

## 2018-10-24 DIAGNOSIS — D2372 Other benign neoplasm of skin of left lower limb, including hip: Secondary | ICD-10-CM | POA: Diagnosis not present

## 2018-11-26 ENCOUNTER — Ambulatory Visit
Admission: EM | Admit: 2018-11-26 | Discharge: 2018-11-26 | Disposition: A | Discharge status: Home / Self Care | Payer: BLUE CROSS/BLUE SHIELD | Attending: Family Medicine | Admitting: Family Medicine

## 2018-11-26 ENCOUNTER — Other Ambulatory Visit: Payer: Self-pay

## 2018-11-26 ENCOUNTER — Encounter: Payer: Self-pay | Admitting: Emergency Medicine

## 2018-11-26 ENCOUNTER — Ambulatory Visit
Admission: RE | Admit: 2018-11-26 | Discharge: 2018-11-26 | Disposition: A | Discharge status: Home / Self Care | Payer: BLUE CROSS/BLUE SHIELD | Source: Ambulatory Visit | Attending: Family Medicine | Admitting: Family Medicine

## 2018-11-26 DIAGNOSIS — R1011 Right upper quadrant pain: Secondary | ICD-10-CM | POA: Insufficient documentation

## 2018-11-26 DIAGNOSIS — M546 Pain in thoracic spine: Secondary | ICD-10-CM

## 2018-11-26 DIAGNOSIS — M549 Dorsalgia, unspecified: Secondary | ICD-10-CM | POA: Insufficient documentation

## 2018-11-26 DIAGNOSIS — K7689 Other specified diseases of liver: Secondary | ICD-10-CM | POA: Diagnosis not present

## 2018-11-26 LAB — COMPREHENSIVE METABOLIC PANEL
ALT: 21 U/L (ref 0–44)
AST: 18 U/L (ref 15–41)
Albumin: 4.6 g/dL (ref 3.5–5.0)
Alkaline Phosphatase: 79 U/L (ref 38–126)
Anion gap: 9 (ref 5–15)
BUN: 11 mg/dL (ref 6–20)
CO2: 24 mmol/L (ref 22–32)
Calcium: 9.2 mg/dL (ref 8.9–10.3)
Chloride: 102 mmol/L (ref 98–111)
Creatinine, Ser: 0.77 mg/dL (ref 0.44–1.00)
GFR calc Af Amer: >60 mL/min (ref >60)
GFR calc non Af Amer: >60 mL/min (ref >60)
Glucose, Bld: 87 mg/dL (ref 70–99)
Potassium: 3.9 mmol/L (ref 3.5–5.1)
Sodium: 135 mmol/L (ref 135–145)
Total Bilirubin: 0.4 mg/dL (ref 0.3–1.2)
Total Protein: 8.1 g/dL (ref 6.5–8.1)

## 2018-11-26 LAB — CBC WITH DIFFERENTIAL/PLATELET
Abs Immature Granulocytes: 0.04 10*3/uL (ref 0.00–0.07)
Basophils Absolute: 0.1 10*3/uL (ref 0.0–0.1)
Basophils Relative: 1 %
Eosinophils Absolute: 0.2 10*3/uL (ref 0.0–0.5)
Eosinophils Relative: 2 %
HCT: 43.1 % (ref 36.0–46.0)
Hemoglobin: 15 g/dL (ref 12.0–15.0)
Immature Granulocytes: 0 %
Lymphocytes Relative: 24 %
Lymphs Abs: 2.6 10*3/uL (ref 0.7–4.0)
MCH: 31.9 pg (ref 26.0–34.0)
MCHC: 34.8 g/dL (ref 30.0–36.0)
MCV: 91.7 fL (ref 80.0–100.0)
Monocytes Absolute: 0.8 10*3/uL (ref 0.1–1.0)
Monocytes Relative: 8 %
Neutro Abs: 7.2 10*3/uL (ref 1.7–7.7)
Neutrophils Relative %: 65 %
Platelets: 334 10*3/uL (ref 150–400)
RBC: 4.7 MIL/uL (ref 3.87–5.11)
RDW: 12.3 % (ref 11.5–15.5)
WBC: 10.9 10*3/uL — ABNORMAL HIGH (ref 4.0–10.5)
nRBC: 0 % (ref 0.0–0.2)

## 2018-11-26 NOTE — Discharge Instructions (Signed)
We will call with the results as soon as we have them.  Take care  Dr. Lacinda Axon

## 2018-11-26 NOTE — ED Provider Notes (Signed)
[facility]    CSN: [id number] Arrival date & time: 11/26/18  1504  History   Chief Complaint Chief Complaint  Patient presents with  . Abdominal Pain   HPI  48 year old female presents with abdominal pain and back pain.  Patient reports that she has had ongoing abdominal pain as well as back pain since this past Thursday.  She reports right upper quadrant pain and lower thoracic midline back pain.  She has a history of back pain.  She thought that this was muscular but it has not improved.  She states that her pain is quite severe.  Currently 8/10 in severity.  She reports some associated nausea and vomiting.  She is also had constipation as well as a lot of gas.  As of this morning she had diarrhea.  No documented fever.  She has not checked her temperature.  No known relieving factors.  No known exacerbating factors.  No other complaints.  PMH, Surgical Hx, Family Hx, Social History reviewed and updated as below.  Past Medical History:  Diagnosis Date  . GERD (gastroesophageal reflux disease)   . Headache    migraines  . Hypothyroid   . Thyroid cancer ([place]) 2001  . Tobacco abuse    Patient Active Problem List   Diagnosis Date Noted  . Abnormal uterine bleeding (AUB) 03/08/2018  . S/P hysterectomy 03/19/2017  . Irregular menses 06/04/2014  . Thoracic back pain 06/04/2014  . 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 Hypothyroid   . Tobacco abuse   . Thyroid cancer [facility])     Past Surgical History:  Procedure Laterality Date  . BILATERAL SALPINGECTOMY Bilateral 03/19/2017   Procedure: BILATERAL SALPINGECTOMY;  Surgeon: Ward, Honor Loh, MD;  Location: [facility] ORS;  Service: Gynecology;  Laterality: Bilateral;  . CESAREAN SECTION    . CYSTOSCOPY N/A 03/19/2017   Procedure: CYSTOSCOPY;  Surgeon: Ward, Honor Loh, MD;  Location: [facility] OR[facility];  Service: Gynecology;  Laterality: N/A;  . DILATION AND CURETTAGE OF UTERUS    . KNEE ARTHROSCOPY Left   . thyroidectomy  2001  .  VAGINAL HYSTERECTOMY N/A 03/19/2017   Procedure: HYSTERECTOMY VAGINAL;  Surgeon: Ward, Honor Loh, MD;  Location: [facility] ORS;  Service: Gynecology;  Laterality: N/A;    OB History    Gravida  4   Para  4   Term  4   Preterm      AB      Living  4     SAB      TAB      Ectopic      Multiple      Live Births               Home Medications    Prior to Admission medications   Medication Sig Start Date End Date Taking? Authorizing Provider  escitalopram (LEXAPRO) 10 MG tablet Take 10 mg by mouth daily.   Yes [provider]  levothyroxine (SYNTHROID, LEVOTHROID) 112 MCG tablet TAKE 1 TABLET BY MOUTH DAILY 11/21/16  Yes Copland, Frederico Hamman, MD    Family History Family History  Problem Relation Age of Onset  . Alzheimer's disease Father   . Cancer Father     Social History Social History   Tobacco Use  . Smoking status: Current Every Day Smoker    Packs/day: 0.50    Types: Cigarettes  . Smokeless tobacco: Never Used  Substance Use Topics  . Alcohol use: Yes    Comment: social  . Drug use:  No     Allergies   Patient has no known allergies.   Review of Systems Review of Systems  Constitutional: Negative for fever.  Gastrointestinal: Positive for abdominal pain, constipation, diarrhea and nausea.  Musculoskeletal: Positive for back pain.   Physical Exam Triage Vital Signs ED Triage Vitals  Enc Vitals Group     BP 11/26/18 1531 (!) 143/72     Pulse Rate 11/26/18 1531 77     Resp 11/26/18 1531 18     Temp 11/26/18 1531 98.6 F (37 C)     Temp Source 11/26/18 1531 Oral     SpO2 11/26/18 1531 100 %     Weight 11/26/18 1534 209 lb (94.8 kg)     Height 11/26/18 1534 5\' 7"  (1.702 m)     Head Circumference --      Peak Flow --      Pain Score 11/26/18 1533 8     Pain Loc --      Pain Edu? --      Excl. in [place]? --    Updated Vital Signs BP (!) 143/72 (BP Location: Left Arm)   Pulse 77   Temp 98.6 F (37 C) (Oral)   Resp 18   Ht 5\' 7"   (1.702 m)   Wt 94.8 kg   LMP 03/09/2017   SpO2 100%   BMI 32.73 kg/m   Visual Acuity Right Eye Distance:   Left Eye Distance:   Bilateral Distance:    Right Eye Near:   Left Eye Near:    Bilateral Near:     Physical Exam Vitals signs and nursing note reviewed.  Constitutional:      General: She is not in acute distress.    Appearance: She is well-developed.  HENT:     Head: Normocephalic and atraumatic.  Eyes:     General: No scleral icterus.       Right eye: No discharge.        Left eye: No discharge.     Conjunctiva/sclera: Conjunctivae normal.  Cardiovascular:     Rate and Rhythm: Normal rate and regular rhythm.  Pulmonary:     Effort: Pulmonary effort is normal.     Breath sounds: Normal breath sounds.  Abdominal:     General: There is no distension.     Palpations: Abdomen is soft.     Comments: Right upper quadrant tenderness to palpation.  Neurological:     Mental Status: She is alert.  Psychiatric:        Behavior: Behavior normal.     Comments: Flat affect.    UC Treatments / Results  Labs (all labs ordered are listed, but only abnormal results are displayed) Labs Reviewed  CBC WITH DIFFERENTIAL/PLATELET - Abnormal; Notable for the following components:      Result Value   WBC 10.9 (*)    All other components within normal limits  COMPREHENSIVE METABOLIC PANEL    EKG None  Radiology No results found.  Procedures Procedures (including critical care time)  Medications Ordered in UC Medications - No data to display  Initial Impression / Assessment and Plan / UC Course  I have reviewed the triage vital signs and the nursing notes.  Pertinent labs & imaging results that were available during my care of the patient were reviewed by me and considered in my medical decision making (see chart for details).    47 year old female presents with right upper quadrant abdominal pain and back pain.  CBC obtained  today and revealed a mild leukocytosis  of 10.9.  Awaiting CMP results.  Sending to hospital for ultrasound of the right upper quadrant.  Final Clinical Impressions(s) / UC Diagnoses   Final diagnoses:  RUQ pain  Acute midline thoracic back pain     Discharge Instructions     We will call with the results as soon as we have them.  Take care  Dr. Lacinda Axon     ED Prescriptions    None     Controlled Substance Prescriptions [place] Controlled Substance Registry consulted? Not Applicable   Coral Spikes, DO 11/26/18 1621

## 2018-11-26 NOTE — ED Triage Notes (Signed)
Patient states she has had back pain and abdominal pain.  Patient states she has been constipated and intermittently has had diarrhea

## 2019-05-21 DIAGNOSIS — E890 Postprocedural hypothyroidism: Secondary | ICD-10-CM | POA: Diagnosis not present

## 2019-05-21 DIAGNOSIS — C73 Malignant neoplasm of thyroid gland: Secondary | ICD-10-CM | POA: Diagnosis not present

## 2019-09-02 ENCOUNTER — Ambulatory Visit (INDEPENDENT_AMBULATORY_CARE_PROVIDER_SITE_OTHER): Payer: BC Managed Care – PPO | Admitting: Family Medicine

## 2019-09-02 ENCOUNTER — Other Ambulatory Visit: Payer: Self-pay

## 2019-09-02 VITALS — BP 112/78 | HR 79 | Temp 98.2°F | Ht 67.0 in | Wt 221.8 lb

## 2019-09-02 DIAGNOSIS — B029 Zoster without complications: Secondary | ICD-10-CM | POA: Diagnosis not present

## 2019-09-02 MED ORDER — VALACYCLOVIR HCL 1 G PO TABS: 1000.0000 mg | ORAL_TABLET | Freq: Three times a day (TID) | ORAL | 0 refills | Status: DC

## 2019-09-02 NOTE — Progress Notes (Signed)
Chief Complaint  Patient presents with  . Mouth Lesions    on lower left lip, with redness and swelling and itching   . Fatigue    pt is concerned about low energy   . Headache    History of Present Illness: HPI  49 year old female patient of Dr. Lillie Fragmin with history of ocular migraine presents with new onset  Mouth lesions on lower left lip. She has also been feeling fatigued and has headaches increased in last week.  She reports she first noted 5 days ago several  blisters on left lower lip.  Painful area has progressed to tenderness in scalp on left.  Tenderness in left face.  No visual changes except left eye itchy. Initially swelling left cheek and lip.Marland Kitchen. now improved.   No spread of rash and redness... limited to lip.   No SOB, no dysphagia. No fever.   She has had shingles in the same area 9 years ago. Has scar under lip in that area.  She has been using tylenol and ibuprofen.. this controls most pain.   This visit occurred during the SARS-CoV-2 public health emergency.  Safety protocols were in place, including screening questions prior to the visit, additional usage of staff PPE, and extensive cleaning of exam room while observing appropriate contact time as indicated for disinfecting solutions.   COVID 19 screen:  No recent travel or known exposure to COVID19 The patient denies respiratory symptoms of COVID 19 at this time. The importance of social distancing was discussed today.     Review of Systems  Constitutional: Positive for malaise/fatigue. Negative for chills and fever.  HENT: Negative for congestion and ear pain.   Eyes: Negative for pain and redness.  Respiratory: Negative for cough and shortness of breath.   Cardiovascular: Negative for chest pain, palpitations and leg swelling.  Gastrointestinal: Negative for abdominal pain, blood in stool, constipation, diarrhea, nausea and vomiting.  Genitourinary: Negative for dysuria.  Musculoskeletal:  Negative for falls and myalgias.  Skin: Positive for rash.  Neurological: Negative for dizziness.  Psychiatric/Behavioral: Negative for depression. The patient is not nervous/anxious.       Past Medical History:  Diagnosis Date  . GERD (gastroesophageal reflux disease)   . Headache    migraines  . Hypothyroid   . Thyroid cancer ([place]) 2001  . Tobacco abuse     reports that she has been smoking cigarettes. She has been smoking about 0.50 packs per day. She has never used smokeless tobacco. She reports current alcohol use. She reports that she does not use drugs.   Current Outpatient Medications:  .  escitalopram (LEXAPRO) 10 MG tablet, Take 10 mg by mouth daily., Disp: , Rfl:  .  levothyroxine (SYNTHROID, LEVOTHROID) 112 MCG tablet, TAKE 1 TABLET BY MOUTH DAILY, Disp: 15 tablet, Rfl: 0   Observations/Objective: Height 5\' 7"  (1.702 m), weight 221 lb 12.8 oz (100.6 kg), last menstrual period 03/09/2017.  Physical Exam Constitutional:      General: She is not in acute distress.    Appearance: Normal appearance. She is well-developed. She is not ill-appearing or toxic-appearing.  HENT:     Head: Normocephalic.     Right Ear: Hearing, tympanic membrane, ear canal and external ear normal. Tympanic membrane is not erythematous, retracted or bulging.     Left Ear: Hearing, tympanic membrane, ear canal and external ear normal. Tympanic membrane is not erythematous, retracted or bulging.     Nose: No mucosal edema or rhinorrhea.  Right Sinus: No maxillary sinus tenderness or frontal sinus tenderness.     Left Sinus: No maxillary sinus tenderness or frontal sinus tenderness.     Mouth/Throat:     Lips: Lesions present.     Dentition: Normal dentition. No dental tenderness.     Tongue: No lesions. Tongue does not deviate from midline.     Pharynx: Oropharynx is clear. Uvula midline. No pharyngeal swelling or oropharyngeal exudate.     Comments:  Multiple blisters on left lower  Lip, mild  swelling. Eyes:     General: Lids are normal. Lids are everted, no foreign bodies appreciated.     Conjunctiva/sclera: Conjunctivae normal.     Pupils: Pupils are equal, round, and reactive to light.  Neck:     Thyroid: No thyroid mass or thyromegaly.     Vascular: No carotid bruit.     Trachea: Trachea normal.  Cardiovascular:     Rate and Rhythm: Normal rate and regular rhythm.     Pulses: Normal pulses.     Heart sounds: Normal heart sounds, S1 normal and S2 normal. No murmur. No friction rub. No gallop.   Pulmonary:     Effort: Pulmonary effort is normal. No tachypnea or respiratory distress.     Breath sounds: Normal breath sounds. No decreased breath sounds, wheezing, rhonchi or rales.  Abdominal:     General: Bowel sounds are normal.     Palpations: Abdomen is soft.     Tenderness: There is no abdominal tenderness.  Musculoskeletal:     Cervical back: Normal range of motion and neck supple.  Skin:    General: Skin is warm and dry.     Findings: No rash.     Comments: Left facial swelling mild... has picture from 2 days ago when swelling more severe, now improved.  No oropharyngeal swelling.  Neurological:     Mental Status: She is alert.  Psychiatric:        Mood and Affect: Mood is not anxious or depressed.        Speech: Speech normal.        Behavior: Behavior normal. Behavior is cooperative.        Thought Content: Thought content normal.        Judgment: Judgment normal.      Assessment and Plan   Herpes zoster without complication  HSV vs zoster.Marland Kitchen given left facial and scalp issue... faver zoster.  treat with valacyclovir and pain control.  recommend eye exam  For safety  given itchiness on left eye and, tingling in entire left face.     Eliezer Lofts, MD

## 2019-09-02 NOTE — Patient Instructions (Signed)
Complete valacyclovir course x 7 days.  Have eye exam if able to make sure. No herpes zoster in eye.  Call if pain not controlled.  Shingles  Shingles, which is also known as herpes zoster, is an infection that causes a painful skin rash and fluid-filled blisters. It is caused by a virus. Shingles only develops in people who:  Have had chickenpox.  Have been given a medicine to protect against chickenpox (have been vaccinated). Shingles is rare in this group. What are the causes? Shingles is caused by varicella-zoster virus (VZV). This is the same virus that causes chickenpox. After a person is exposed to VZV, the virus stays in the body in an inactive (dormant) state. Shingles develops if the virus is reactivated. This can happen many years after the first (initial) exposure to VZV. It is not known what causes this virus to be reactivated. What increases the risk? People who have had chickenpox or received the chickenpox vaccine are at risk for shingles. Shingles infection is more common in people who:  Are older than age 5.  Have a weakened disease-fighting system (immune system), such as people with: ? HIV. ? AIDS. ? Cancer.  Are taking medicines that weaken the immune system, such as transplant medicines.  Are experiencing a lot of stress. What are the signs or symptoms? Early symptoms of this condition include itching, tingling, and pain in an area on your skin. Pain may be described as burning, stabbing, or throbbing. A few days or weeks after early symptoms start, a painful red rash appears. The rash is usually on one side of the body and has a band-like or belt-like pattern. The rash eventually turns into fluid-filled blisters that break open, change into scabs, and dry up in about 2-3 weeks. At any time during the infection, you may also develop:  A fever.  Chills.  A headache.  An upset stomach. How is this diagnosed? This condition is diagnosed with a skin exam.  Skin or fluid samples may be taken from the blisters before a diagnosis is made. These samples are examined under a microscope or sent to a lab for testing. How is this treated? The rash may last for several weeks. There is not a specific cure for this condition. Your health care provider will probably prescribe medicines to help you manage pain, recover more quickly, and avoid long-term problems. Medicines may include:  Antiviral drugs.  Anti-inflammatory drugs.  Pain medicines.  Anti-itching medicines (antihistamines). If the area involved is on your face, you may be referred to a specialist, such as an eye doctor (ophthalmologist) or an ear, nose, and throat (ENT) doctor (otolaryngologist) to help you avoid eye problems, chronic pain, or disability. Follow these instructions at home: Medicines  Take over-the-counter and prescription medicines only as told by your health care provider.  Apply an anti-itch cream or numbing cream to the affected area as told by your health care provider. Relieving itching and discomfort   Apply cold, wet cloths (cold compresses) to the area of the rash or blisters as told by your health care provider.  Cool baths can be soothing. Try adding baking soda or dry oatmeal to the water to reduce itching. Do not bathe in hot water. Blister and rash care  Keep your rash covered with a loose bandage (dressing). Wear loose-fitting clothing to help ease the pain of material rubbing against the rash.  Keep your rash and blisters clean by washing the area with mild soap and cool water  as told by your health care provider.  Check your rash every day for signs of infection. Check for: ? More redness, swelling, or pain. ? Fluid or blood. ? Warmth. ? Pus or a bad smell.  Do not scratch your rash or pick at your blisters. To help avoid scratching: ? Keep your fingernails clean and cut short. ? Wear gloves or mittens while you sleep, if scratching is a  problem. General instructions  Rest as told by your health care provider.  Keep all follow-up visits as told by your health care provider. This is important.  Wash your hands often with soap and water. If soap and water are not available, use hand sanitizer. Doing this lowers your chance of getting a bacterial skin infection.  Before your blisters change into scabs, your shingles infection can cause chickenpox in people who have never had it or have never been vaccinated against it. To prevent this from happening, avoid contact with other people, especially: ? Babies. ? Pregnant women. ? Children who have eczema. ? Elderly people who have transplants. ? People who have chronic illnesses, such as cancer or AIDS. Contact a health care provider if:  Your pain is not relieved with prescribed medicines.  Your pain does not get better after the rash heals.  You have signs of infection in the rash area, such as: ? More redness, swelling, or pain around the rash. ? Fluid or blood coming from the rash. ? The rash area feeling warm to the touch. ? Pus or a bad smell coming from the rash. Get help right away if:  The rash is on your face or nose.  You have facial pain, pain around your eye area, or loss of feeling on one side of your face.  You have difficulty seeing.  You have ear pain or have ringing in your ear.  You have a loss of taste.  Your condition gets worse. Summary  Shingles, which is also known as herpes zoster, is an infection that causes a painful skin rash and fluid-filled blisters.  This condition is diagnosed with a skin exam. Skin or fluid samples may be taken from the blisters and examined before the diagnosis is made.  Keep your rash covered with a loose bandage (dressing). Wear loose-fitting clothing to help ease the pain of material rubbing against the rash.  Before your blisters change into scabs, your shingles infection can cause chickenpox in people who  have never had it or have never been vaccinated against it. This information is not intended to replace advice given to you by your health care provider. Make sure you discuss any questions you have with your health care provider. Document Revised: 11/08/2018 Document Reviewed: 03/21/2017 Elsevier Patient Education  2020 [employer].

## 2019-09-02 NOTE — Assessment & Plan Note (Signed)
HSV vs zoster.Marland Kitchen given left facial and scalp issue... faver zoster.  treat with valacyclovir and pain control.  recommend eye exam  For safety  given itchiness on left eye and, tingling in entire left face.

## 2019-09-09 ENCOUNTER — Other Ambulatory Visit: Payer: Self-pay | Admitting: Obstetrics & Gynecology

## 2019-09-09 DIAGNOSIS — Z1231 Encounter for screening mammogram for malignant neoplasm of breast: Secondary | ICD-10-CM

## 2019-09-09 DIAGNOSIS — Z01419 Encounter for gynecological examination (general) (routine) without abnormal findings: Secondary | ICD-10-CM | POA: Diagnosis not present

## 2019-09-09 DIAGNOSIS — Z124 Encounter for screening for malignant neoplasm of cervix: Secondary | ICD-10-CM | POA: Diagnosis not present

## 2019-09-09 DIAGNOSIS — Z0000 Encounter for general adult medical examination without abnormal findings: Secondary | ICD-10-CM | POA: Diagnosis not present

## 2019-09-09 DIAGNOSIS — Z13220 Encounter for screening for lipoid disorders: Secondary | ICD-10-CM | POA: Diagnosis not present

## 2019-10-03 ENCOUNTER — Ambulatory Visit
Admission: RE | Admit: 2019-10-03 | Discharge: 2019-10-03 | Disposition: A | Discharge status: Home / Self Care | Payer: BC Managed Care – PPO | Source: Ambulatory Visit | Attending: Obstetrics & Gynecology | Admitting: Obstetrics & Gynecology

## 2019-10-03 ENCOUNTER — Other Ambulatory Visit: Payer: Self-pay

## 2019-10-03 DIAGNOSIS — Z1231 Encounter for screening mammogram for malignant neoplasm of breast: Secondary | ICD-10-CM

## 2020-05-20 ENCOUNTER — Encounter: Payer: Self-pay | Admitting: Family Medicine

## 2020-06-03 ENCOUNTER — Ambulatory Visit (INDEPENDENT_AMBULATORY_CARE_PROVIDER_SITE_OTHER)
Admission: RE | Admit: 2020-06-03 | Discharge: 2020-06-03 | Disposition: A | Discharge status: Home / Self Care | Payer: BC Managed Care – PPO | Source: Ambulatory Visit | Attending: Family Medicine | Admitting: Family Medicine

## 2020-06-03 ENCOUNTER — Encounter: Payer: Self-pay | Admitting: Family Medicine

## 2020-06-03 ENCOUNTER — Ambulatory Visit: Payer: BC Managed Care – PPO | Admitting: Family Medicine

## 2020-06-03 ENCOUNTER — Other Ambulatory Visit: Payer: Self-pay

## 2020-06-03 VITALS — BP 120/82 | HR 69 | Temp 98.0°F | Ht 67.0 in | Wt 226.5 lb

## 2020-06-03 DIAGNOSIS — M546 Pain in thoracic spine: Secondary | ICD-10-CM | POA: Diagnosis not present

## 2020-06-03 DIAGNOSIS — G8929 Other chronic pain: Secondary | ICD-10-CM

## 2020-06-03 DIAGNOSIS — B070 Plantar wart: Secondary | ICD-10-CM

## 2020-06-03 MED ORDER — PREDNISONE 20 MG PO TABS: ORAL_TABLET | ORAL | 0 refills | Status: DC

## 2020-06-03 MED ORDER — IMIQUIMOD 5 % EX CREA: TOPICAL_CREAM | CUTANEOUS | 1 refills | Status: DC

## 2020-06-03 MED ORDER — CYCLOBENZAPRINE HCL 10 MG PO TABS: 5.0000 mg | ORAL_TABLET | Freq: Every evening | ORAL | 1 refills | Status: DC | PRN

## 2020-06-03 NOTE — Progress Notes (Signed)
[name], MD, [place] at [facility] [place], [address]  Phone: [phone]  FAX: [place] - 49 y.o. female  MRN [id number]  Date of Birth: 11-26-1970  Date: 06/03/2020  PCP: Owens Loffler, MD  Referral: Owens Loffler, MD  Chief Complaint  Patient presents with  . Back Pain    This visit occurred during the SARS-CoV-2 public health emergency.  Safety protocols were in place, including screening questions prior to the visit, additional usage of staff PPE, and extensive cleaning of exam room while observing appropriate contact time as indicated for disinfecting solutions.   Subjective:   Angel Hernandez is a 49 y.o. very pleasant female patient with Body mass index is 35.47 kg/m. who presents with the following:  Never going to be hurting or not with her back, and now in th elast six months, she can slightly move her arm and it will pop and not like a normal one.  She will have pain in the thoracic pain.  Her pain is predominantly in the thoracic spine, and she also does have some pain in the lumbar spine as well.  Almost constant pain.  WIthout using her massager, it will hurt a lot.  In the summer, was buying a house and was outside a ton a lot.  More active and physically active.   Not better at all.  Lost some weight.  Drank some water all day.  Water will help.    No recent radiographs  Wt Readings from Last 3 Encounters:  06/03/20 226 lb 8 oz (102.7 kg)  09/02/19 221 lb 12.8 oz (100.6 kg)  11/26/18 209 lb (94.8 kg)    Plantar warts, both Review of Systems is noted in the HPI, as appropriate   Objective:   BP 120/82   Pulse 69   Temp 98 F (36.7 C) (Temporal)   Ht 5\' 7"  (1.702 m)   Wt 226 lb 8 oz (102.7 kg)   LMP 03/09/2017   SpO2 96%   BMI 35.47 kg/m    Range of motion at  the waist: Flexion, extension, lateral bending  and rotation: She is able to flex, she does have pain with extension, minimal with lateral bending and rotation.   No echymosis or edema Rises to examination table with mild difficulty Gait: minimally antalgic  Inspection/Deformity: N Paraspinus Tenderness: Predominant pain is in the thoracic region bilaterally with a much lower extent at the lumbar region relatively diffusely.  B Ankle Dorsiflexion (L5,4): 5/5 B Great Toe Dorsiflexion (L5,4): 5/5 Heel Walk (L5): WNL Toe Walk (S1): WNL Rise/Squat (L4): WNL, mild pain  SENSORY B Medial Foot (L4): WNL B Dorsum (L5): WNL B Lateral (S1): WNL Light Touch: WNL Pinprick: WNL  REFLEXES Knee (L4): 2+ Ankle (S1): 2+  B SLR, seated: neg B SLR, supine: neg B FABER: neg B Reverse FABER: neg B Greater Troch: NT B Log Roll: neg B Sciatic Notch: NT   Obvious bilateral plantar warts  Radiology: No results found.  Assessment and Plan:     ICD-10-CM   1. Chronic bilateral thoracic back pain  M54.6 DG Thoracic Spine W/Swimmers   G89.29   2. Plantar warts  B07.0    Chronic bilateral back pain, acute on chronic exacerbation. I reviewed spine rehab, and I am also going to pulse her with some steroids and give her Flexeril. Obtain thoracic spine films  and review.  With her chronic plantar warts, she has tried almost everything, but I recommended doing a small amount of all dura and using some duct tape.  Social Determinants of Health  Physical Activity: Limited due to pain   Meds ordered this encounter  Medications  . imiquimod (ALDARA) 5 % cream    Sig: Apply topically 3 (three) times a week.    Dispense:  12 each    Refill:  1  . predniSONE (DELTASONE) 20 MG tablet    Sig: 2 tabs po daily for 5 days, then 1 tab po daily for 5 days    Dispense:  15 tablet    Refill:  0  . cyclobenzaprine (FLEXERIL) 10 MG tablet    Sig: Take 0.5-1 tablets (5-10 mg total) by mouth at bedtime as needed for muscle spasms.    Dispense:  30  tablet    Refill:  1   There are no discontinued medications. Orders Placed This Encounter  Procedures  . DG Thoracic Spine W/Swimmers    Follow-up: No follow-ups on file.  Signed,  Maud Deed. Reise Gladney, MD   Outpatient Encounter Medications as of 06/03/2020  Medication Sig  . escitalopram (LEXAPRO) 10 MG tablet Take 10 mg by mouth daily.  Marland Kitchen levothyroxine (SYNTHROID, LEVOTHROID) 112 MCG tablet TAKE 1 TABLET BY MOUTH DAILY  . valACYclovir (VALTREX) 1000 MG tablet Take 1 tablet (1,000 mg total) by mouth 3 (three) times daily.  . Vitamin D, Ergocalciferol, (DRISDOL) 1.25 MG (50000 UNIT) CAPS capsule Take 50,000 Units by mouth once a week.  . cyclobenzaprine (FLEXERIL) 10 MG tablet Take 0.5-1 tablets (5-10 mg total) by mouth at bedtime as needed for muscle spasms.  Marland Kitchen imiquimod (ALDARA) 5 % cream Apply topically 3 (three) times a week.  . predniSONE (DELTASONE) 20 MG tablet 2 tabs po daily for 5 days, then 1 tab po daily for 5 days   No facility-administered encounter medications on file as of 06/03/2020.

## 2020-08-11 ENCOUNTER — Ambulatory Visit: Payer: BC Managed Care – PPO | Admitting: Family Medicine

## 2020-08-11 DIAGNOSIS — Z0289 Encounter for other administrative examinations: Secondary | ICD-10-CM

## 2020-08-18 ENCOUNTER — Encounter: Payer: Self-pay | Admitting: Family Medicine

## 2020-08-18 ENCOUNTER — Other Ambulatory Visit: Payer: Self-pay

## 2020-08-18 ENCOUNTER — Ambulatory Visit: Payer: BC Managed Care – PPO | Admitting: Family Medicine

## 2020-08-18 VITALS — BP 130/80 | HR 87 | Temp 98.0°F | Ht 67.0 in | Wt 228.2 lb

## 2020-08-18 DIAGNOSIS — M545 Low back pain, unspecified: Secondary | ICD-10-CM | POA: Diagnosis not present

## 2020-08-18 DIAGNOSIS — G8929 Other chronic pain: Secondary | ICD-10-CM

## 2020-08-18 DIAGNOSIS — M546 Pain in thoracic spine: Secondary | ICD-10-CM

## 2020-08-18 NOTE — Patient Instructions (Signed)
Take Tylenol/Acetaminophen ES (500mg ) 2 tabs by mouth three times a day max as needed.   Alleve 2 tabs by mouth two times a day over the counter: Take at least for 2 - 3 weeks. This is equal to a prescripton strength dose (GENERIC CHEAPER EQUIVALENT IS NAPROXEN SODIUM)   Motrin 600 - 800 mg recommended three times a day. (Over the counter Motrin, Advil, or Generic Ibuprofen 200 mg tablets. 3-4 tablets by mouth 3 times a day. This equals a prescription strength dose.)

## 2020-08-18 NOTE — Progress Notes (Signed)
Angel Hernandez T. Angel Glendenning, MD, [place] at [facility] [place], [address]  Phone: [phone]  FAX: [place] - 50 y.o. female  MRN [id number]  Date of Birth: 03-Nov-1970  Date: 08/18/2020  PCP: Angel Loffler, MD  Referral: Angel Loffler, MD  Chief Complaint  Patient presents with  . Back Pain    This visit occurred during the SARS-CoV-2 public health emergency.  Safety protocols were in place, including screening questions prior to the visit, additional usage of staff PPE, and extensive cleaning of exam room while observing appropriate contact time as indicated for disinfecting solutions.   Subjective:   Angel Hernandez is a 50 y.o. very pleasant female patient with Body mass index is 35.75 kg/m. who presents with the following:  He has had a long history of predominantly lumbosacral back pain, more recently I did see her with some thoracic spine pain.  Steroids did help, and had been doing more at work.  Then she got covid.  12/22.  Tested positive.  Went back to work after words.  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  She does think she got more fatigued, and weak after COVID-19.  Still felt very tired just standing there.  All the way around like a belt and felt like a cramp.  Lots of pressure in the hypogastric region.  There is a tight band - feels like something is stretching.  With pulling, things have tensed up.  She denies any weakness or numbness.  No radicular pain.  This week, she is going to have to work about 60 hours over the next 5 days. 10 hours a day for the net 5-6 days. Also has to stock.  Chiro - did not help much.  She has never been to physical therapy.  Also did pull up her lumbar spine x-rays from March 15, 2018: Relatively does have  degenerative disc disease throughout much of her lower back, advanced for age.  Review of Systems is noted in the HPI, as appropriate   Objective:   BP 130/80   Pulse 87   Temp 98 F (36.7 C) (Temporal)   Ht 5\' 7"  (1.702 m)   Wt 228 lb 4 oz (103.5 kg)   LMP 03/09/2017   SpO2 96%   BMI 35.75 kg/m    Range of motion at  the waist: Flexion, extension, lateral bending and rotation: Relatively preserved  No echymosis or edema Rises to examination table with mild difficulty Gait: minimally antalgic  Inspection/Deformity: N Paraspinus Tenderness: Relatively diffuse L2-S1 bilaterally  B Ankle Dorsiflexion (L5,4): 5/5 B Great Toe Dorsiflexion (L5,4): 5/5 Heel Walk (L5): WNL Toe Walk (S1): WNL Rise/Squat (L4): WNL, mild pain  SENSORY B Medial Foot (L4): WNL B Dorsum (L5): WNL B Lateral (S1): WNL Light Touch: WNL Pinprick: WNL  REFLEXES Knee (L4): 2+ Ankle (S1): 2+  B SLR, seated: neg B SLR, supine: neg B FABER: Some pain B Reverse FABER: Some pain  B Greater Troch: NT B Log Roll: neg B Sciatic Notch: TTP  Radiology: DG Thoracic Spine W/Swimmers  Result Date: 06/06/2020 CLINICAL DATA:  Chronic thoracic spine pain. EXAM: THORACIC SPINE - 3 VIEWS COMPARISON:  June 04, 2014. FINDINGS: No acute fracture or spondylolisthesis is noted. Stable anterior wedging  of lower thoracic vertebral body is noted which may be physiologic. Minimal anterior osteophyte formation may be present in the lower thoracic spine. IMPRESSION: No acute abnormality seen in the thoracic spine. Minimal degenerative changes may be present in lower thoracic spine. Electronically Signed   By: Marijo Conception M.D.   On: 06/06/2020 09:23   CLINICAL DATA:  Left lumbar radiculopathy.  EXAM: LUMBAR SPINE - COMPLETE 4+ VIEW  COMPARISON:  Thoracic spine 06/04/2014.  FINDINGS: Soft tissue structures are unremarkable. Thoracolumbar spine scoliosis. Diffuse multilevel degenerative change. Mild T11 and  T12 compression fractures, unchanged from prior exam.  IMPRESSION: Mild T11 and T12 compression fractures, unchanged from prior study of 06/04/2014. Thoracolumbar spine scoliosis. Diffuse degenerative change.   Electronically Signed   By: Marcello Moores  Register   On: 03/15/2018 06:50  Assessment and Plan:     ICD-10-CM   1. Chronic bilateral low back pain without sciatica  M54.50 Ambulatory referral to Physical Therapy   G89.29   2. 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  Multiyear problem, and this is going to be difficult long-term.  She does have advanced her age degenerative changes in her lumbar spine.  I think ideally she could get in as good a shape as possible, lose weight, and work on her core and hip strength.  Think formal physical therapy would be a good idea as well.  Social Determinants of Health  Stress: Increased stress at work Exercise patterns are somewhat limited secondary to pain  Patient Instructions  Take Tylenol/Acetaminophen ES (500mg ) 2 tabs by mouth three times a day max as needed.   Alleve 2 tabs by mouth two times a day over the counter: Take at least for 2 - 3 weeks. This is equal to a prescripton strength dose (GENERIC CHEAPER EQUIVALENT IS NAPROXEN SODIUM)   Motrin 600 - 800 mg recommended three times a day. (Over the counter Motrin, Advil, or Generic Ibuprofen 200 mg tablets. 3-4 tablets by mouth 3 times a day. This equals a prescription strength dose.)     No orders of the defined types were placed in this encounter.  Medications Discontinued During This Encounter  Medication Reason  . predniSONE (DELTASONE) 20 MG tablet Completed Course   Orders Placed This Encounter  Procedures  . Ambulatory referral to Physical Therapy    Follow-up: No follow-ups on file.  Signed,  Angel Deed. Angel Lapine, MD   Outpatient Encounter Medications as of 08/18/2020   Medication Sig  . cyclobenzaprine (FLEXERIL) 10 MG tablet Take 0.5-1 tablets (5-10 mg total) by mouth at bedtime as needed for muscle spasms.  Marland Kitchen escitalopram (LEXAPRO) 10 MG tablet Take 10 mg by mouth daily.  . imiquimod (ALDARA) 5 % cream Apply topically 3 (three) times a week.  . levothyroxine (SYNTHROID, LEVOTHROID) 112 MCG tablet TAKE 1 TABLET BY MOUTH DAILY  . valACYclovir (VALTREX) 1000 MG tablet Take 1 tablet (1,000 mg total) by mouth 3 (three) times daily.  . Vitamin D, Ergocalciferol, (DRISDOL) 1.25 MG (50000 UNIT) CAPS capsule Take 50,000 Units by mouth once a week.  . [DISCONTINUED] predniSONE (DELTASONE) 20 MG tablet 2 tabs po daily for 5 days, then 1 tab po daily for 5 days   No facility-administered encounter medications on file as of 08/18/2020.

## 2020-10-15 ENCOUNTER — Telehealth: Payer: Self-pay

## 2020-10-15 NOTE — Telephone Encounter (Signed)
Received referral from Dr. Leonides Schanz. Records reviewed. Contacted Ms. Vaillancourt and appointment scheduled for 10/27/20 at 0830. Directions to the cancer center given.

## 2020-10-27 ENCOUNTER — Inpatient Hospital Stay: Payer: BC Managed Care – PPO | Attending: Obstetrics and Gynecology | Admitting: Obstetrics and Gynecology

## 2020-10-27 VITALS — BP 126/63 | HR 79 | Temp 98.6°F | Ht 67.0 in | Wt 229.6 lb

## 2020-10-27 DIAGNOSIS — B029 Zoster without complications: Secondary | ICD-10-CM | POA: Diagnosis not present

## 2020-10-27 DIAGNOSIS — Z90710 Acquired absence of both cervix and uterus: Secondary | ICD-10-CM | POA: Diagnosis not present

## 2020-10-27 DIAGNOSIS — Z86002 Personal history of in-situ neoplasm of other and unspecified genital organs: Secondary | ICD-10-CM | POA: Insufficient documentation

## 2020-10-27 DIAGNOSIS — Z79899 Other long term (current) drug therapy: Secondary | ICD-10-CM | POA: Diagnosis not present

## 2020-10-27 DIAGNOSIS — F17210 Nicotine dependence, cigarettes, uncomplicated: Secondary | ICD-10-CM | POA: Diagnosis not present

## 2020-10-27 DIAGNOSIS — D071 Carcinoma in situ of vulva: Secondary | ICD-10-CM | POA: Insufficient documentation

## 2020-10-27 DIAGNOSIS — Z85850 Personal history of malignant neoplasm of thyroid: Secondary | ICD-10-CM | POA: Insufficient documentation

## 2020-10-27 DIAGNOSIS — K219 Gastro-esophageal reflux disease without esophagitis: Secondary | ICD-10-CM | POA: Diagnosis not present

## 2020-10-27 DIAGNOSIS — M546 Pain in thoracic spine: Secondary | ICD-10-CM | POA: Diagnosis not present

## 2020-10-27 DIAGNOSIS — E039 Hypothyroidism, unspecified: Secondary | ICD-10-CM | POA: Insufficient documentation

## 2020-10-27 DIAGNOSIS — N764 Abscess of vulva: Secondary | ICD-10-CM | POA: Diagnosis not present

## 2020-10-27 NOTE — Progress Notes (Signed)
Gynecologic Oncology Consult Visit   Referring Provider: Dr. Leonides Schanz  Chief Complaint: VIN 3  Subjective:  Angel Hernandez is a 51 y.o. female who is seen in consultation from Dr. Leonides Schanz for [facility].   Previously area was thought to be lichen and was treated with steroids. No improvement. She was treated with imiquimod 5% topically three times a week by Dr. Lorelei Pont. No improvement.   On well woman exam, Dr. Leonides Schanz noted: Anus/Perineum: Normal external exam, no engorged hemorrhoids. On LEFT, 3cm from anus is a now raised layered mass 1.5cm x 1.5cm. 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  Pap 09/09/19- NILM, HPV Negative  Also develops boils in vulva occasionally.  Has one now on the right inner thigh that just drained spontaneously yesterday.  No fever.   Hysterectomy 2018 for AUB.  Problem List: Patient Active Problem List   Diagnosis Date Noted  . Herpes zoster without complication 32/95/1884  . Abnormal uterine bleeding (AUB) 03/08/2018  . S/P hysterectomy 03/19/2017  . Irregular menses 06/04/2014  . Thoracic back pain 06/04/2014  . 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 Hypothyroid   . Tobacco abuse   . Thyroid cancer [facility])     Past Medical History: Past Medical History:  Diagnosis Date  . GERD (gastroesophageal reflux disease)   . Headache    migraines  . Hypothyroid   . Thyroid cancer ([place]) 2001  . Tobacco abuse     Past Surgical History: Past Surgical History:  Procedure Laterality Date  . BILATERAL SALPINGECTOMY Bilateral 03/19/2017   Procedure: BILATERAL SALPINGECTOMY;  Surgeon: Ward, Honor Loh, MD;  Location: [facility] ORS;  Service: Gynecology;  Laterality: Bilateral;  . CESAREAN SECTION    . CYSTOSCOPY N/A 03/19/2017   Procedure: CYSTOSCOPY;  Surgeon: Ward, Honor Loh, MD;  Location: [facility] ORS;  Service: Gynecology;  Laterality: N/A;  . DILATION  AND CURETTAGE OF UTERUS    . KNEE ARTHROSCOPY Left   . thyroidectomy  2001  . VAGINAL HYSTERECTOMY N/A 03/19/2017   Procedure: HYSTERECTOMY VAGINAL;  Surgeon: Ward, Honor Loh, MD;  Location: [facility] ORS;  Service: Gynecology;  Laterality: N/A;    Past Gynecologic History:  - Patient's last menstrual period was 10/09/2016..  - Menopause age: Hyst - history of STI: No Contraception: Hyst Gravida Para Term Preterm AB Living  6 4 3 1 2 4   SAB IAB Ectopic Molar Multiple Live Births  2 4   OB History:  OB History  Gravida Para Term Preterm AB Living  4 4 4     4   SAB IAB Ectopic Multiple Live Births               # Outcome Date GA Lbr Len/2nd Weight Sex Delivery Anes PTL Lv  4 Term           3 Term           2 Term           1 Term             Family History: Family History  Problem Relation Age of Onset  . Alzheimer's disease Father   . Cancer Father   . Breast cancer Neg Hx     Social History: Social History   Socioeconomic History  . Marital status: Married    Spouse name: Not on file  . Number of children: Not on  file  . Years of education: Not on file  . Highest education level: Not on file  Occupational History  . 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 Smoking status: Current Every Day Smoker    Packs/day: 0.50    Types: Cigarettes  . Smokeless tobacco: Never Used  Vaping Use  . Vaping Use: Some days  Substance and Sexual Activity  . Alcohol use: Yes    Comment: social  . Drug use: No  . Sexual activity: Yes    Partners: Male  Other Topics Concern  . Not on file  Social History Narrative   4 children - 20, 10, 16, 6.   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 Influenza Inj Mdck Quad Pf 05/14/2018  . Tdap 09/22/2015    Allergies: No Known Allergies  Current Medications: Current Outpatient Medications  Medication Sig Dispense Refill  . cyclobenzaprine (FLEXERIL) 10 MG tablet Take 0.5-1 tablets (5-10 mg total) by mouth at bedtime as needed for muscle spasms. 30 tablet 1  . escitalopram (LEXAPRO) 10 MG tablet Take 10 mg by mouth daily.    . imiquimod (ALDARA) 5 % cream Apply topically 3 (three) times a week. 12 each 1  . levothyroxine (SYNTHROID, LEVOTHROID) 112 MCG tablet TAKE 1 TABLET BY MOUTH DAILY 15 tablet 0  . valACYclovir (VALTREX) 1000 MG tablet Take 1 tablet (1,000 mg total) by mouth 3 (three) times daily. 21 tablet 0  . Vitamin D, Ergocalciferol, (DRISDOL) 1.25 MG (50000 UNIT) CAPS capsule Take 50,000 Units by mouth once a week.     No current facility-administered medications for this visit.    General: negative for fevers, changes in weight or night sweats Skin: negative for changes in moles or sores or rash Eyes: negative for changes in vision HEENT: negative for change in hearing, tinnitus, voice changes Pulmonary: negative for dyspnea, orthopnea, productive cough, wheezing Cardiac: negative for palpitations, pain Gastrointestinal: negative for nausea, vomiting, constipation, diarrhea, hematemesis, hematochezia Genitourinary/Sexual: negative for dysuria, retention, hematuria, incontinence Ob/Gyn:  negative for abnormal bleeding, or pain Musculoskeletal: negative for pain, joint pain, back pain Hematology: negative for easy bruising, abnormal bleeding Neurologic/Psych: negative for headaches, seizures, paralysis, weakness, numbness   Objective:  Physical Examination:  LMP 03/09/2017   Vitals:   10/27/20 0847  BP: 126/63  Pulse: 79  Temp: 98.6 F (37 C)      ECOG  Performance Status: 1 - Symptomatic but completely ambulatory  GENERAL: Patient is a well appearing female in no acute distress NODES:  No cervical, supraclavicular, axillary, or inguinal lymphadenopathy palpated.  LUNGS:  Clear to auscultation bilaterally.  No wheezes or rhonchi. HEART:  Regular rate and rhythm. No murmur appreciated. ABDOMEN:  Soft, nontender.  Positive, normoactive bowel sounds.  MSK:  No focal spinal tenderness to palpation. Full range of motion bilaterally in the upper extremities. EXTREMITIES:  No peripheral edema.   SKIN:  Clear with no obvious rashes or skin changes. No nail dyscrasia. NEURO:  Nonfocal. Well oriented.  Appropriate affect.  Pelvic: Chaperoned by Nursing EGBUS: 2 x 2 cm flat mildly pigmented area of VIN with sharp border on left lower vulva well below introitus.   Boil on right inner thigh has drained.  Some redness and swelling, but not tender Cervix/uterus: absent Vagina: no lesions, no discharge or bleeding Adnexa: no palpable masses Rectovaginal: confirmatory    Colposcopy of vulva done after time out and no other lesions seen other than the one mentioned above.   Assessment:  Angel Hernandez is a 50 y.o. 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  No concern for invasion.  No other lesions on colposcopy.  Also develops boils in vulva occasionally.  Has one now on the right inner thigh that just drained spontaneously yesterday. Mild erthyema.  Do not think antibiotics needed.    Hysterectomy 2018 for AUB.   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  We can do laser ablation as an office procedure at [facility] with local anesthesia and she would prefer this to going to the  OR here at [facility] for wide excision.   The patient's diagnosis, an outline of the further diagnostic and laboratory studies which will be required, the recommendation for surgery, and alternatives were discussed with her and her accompanying family members.  All questions were answered to their satisfaction.  A total of 40 minutes were spent with the patient/family today; 50% was spent in education, counseling and coordination of care for VIN3.    Verlon Au, NP  I personally interviewed and examined the patient. Agreed with the above/below plan of care. I have directly contributed to assessment and plan of care of this patient and educated and discussed with patient and family.  Mellody Drown, MD  CC:  Owens Loffler, MD [address] [place],  [place] [address] [phone]

## 2020-12-15 ENCOUNTER — Inpatient Hospital Stay: Payer: BC Managed Care – PPO | Attending: Obstetrics and Gynecology | Admitting: Obstetrics and Gynecology

## 2020-12-15 ENCOUNTER — Other Ambulatory Visit: Payer: Self-pay

## 2020-12-15 VITALS — BP 129/66 | HR 76 | Temp 97.6°F | Resp 18 | Wt 228.5 lb

## 2020-12-15 DIAGNOSIS — D071 Carcinoma in situ of vulva: Secondary | ICD-10-CM | POA: Diagnosis present

## 2020-12-15 DIAGNOSIS — K219 Gastro-esophageal reflux disease without esophagitis: Secondary | ICD-10-CM | POA: Insufficient documentation

## 2020-12-15 DIAGNOSIS — E039 Hypothyroidism, unspecified: Secondary | ICD-10-CM | POA: Insufficient documentation

## 2020-12-15 DIAGNOSIS — B029 Zoster without complications: Secondary | ICD-10-CM | POA: Diagnosis not present

## 2020-12-15 DIAGNOSIS — Z90710 Acquired absence of both cervix and uterus: Secondary | ICD-10-CM | POA: Insufficient documentation

## 2020-12-15 DIAGNOSIS — Z79899 Other long term (current) drug therapy: Secondary | ICD-10-CM | POA: Diagnosis not present

## 2020-12-15 DIAGNOSIS — F17210 Nicotine dependence, cigarettes, uncomplicated: Secondary | ICD-10-CM | POA: Diagnosis not present

## 2020-12-15 NOTE — Progress Notes (Signed)
Gynecologic Oncology Consult Visit   Referring Provider: Dr. Leonides Schanz  Chief Complaint: VIN 3  Subjective:  Angel Hernandez is a 50 y.o. female who is seen in consultation from Dr. Leonides Schanz for [facility].   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  Here for post op check and has no complaints.   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 No improvement. She was treated with imiquimod 5% topically three times a week by Dr. Lorelei Pont. No improvement.   On well woman exam, Dr. Leonides Schanz noted: Anus/Perineum: Normal external exam, no engorged hemorrhoids. On LEFT, 3cm from anus is a now raised layered mass 1.5cm x 1.5cm. 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  Pap 09/09/19- NILM, HPV Negative  Also develops boils in vulva occasionally.  Has one now on the right inner thigh that just drained spontaneously yesterday.  No fever.   Hysterectomy 2018 for AUB.  Problem List: Patient Active Problem List   Diagnosis Date Noted  . 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 Herpes zoster without complication 96/78/9381  . Abnormal uterine bleeding (AUB) 03/08/2018  . S/P hysterectomy 03/19/2017  . Irregular menses 06/04/2014  . Thoracic back pain 06/04/2014  . 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 Hypothyroid   . Tobacco abuse   . Thyroid cancer [facility])     Past Medical History: Past Medical History:  Diagnosis Date  . GERD (gastroesophageal reflux disease)   . Headache    migraines  . Hypothyroid   . Thyroid cancer ([place]) 2001  . Tobacco abuse     Past Surgical History: Past Surgical History:  Procedure Laterality Date  . BILATERAL SALPINGECTOMY Bilateral 03/19/2017   Procedure: BILATERAL SALPINGECTOMY;  Surgeon: Ward, Honor Loh, MD;  Location: [facility] ORS;   Service: Gynecology;  Laterality: Bilateral;  . CESAREAN SECTION    . CYSTOSCOPY N/A 03/19/2017   Procedure: CYSTOSCOPY;  Surgeon: Ward, Honor Loh, MD;  Location: [facility] ORS;  Service: Gynecology;  Laterality: N/A;  . DILATION AND CURETTAGE OF UTERUS    . KNEE ARTHROSCOPY Left   . thyroidectomy  2001  . VAGINAL HYSTERECTOMY N/A 03/19/2017   Procedure: HYSTERECTOMY VAGINAL;  Surgeon: Ward, Honor Loh, MD;  Location: [facility] ORS;  Service: Gynecology;  Laterality: N/A;    Past Gynecologic History:  - Patient's last menstrual period was 10/09/2016..  - Menopause age: Hyst - history of STI: No Contraception: Hyst Gravida Para Term Preterm AB Living  6 4 3 1 2 4   SAB IAB Ectopic Molar Multiple Live Births  2 4   OB History:  OB History  Gravida Para Term Preterm AB Living  4 4 4     4   SAB IAB Ectopic Multiple Live Births               # Outcome Date GA Lbr Len/2nd Weight Sex Delivery Anes PTL Lv  4 Term           3 Term           2 Term           1 Term             Family History: Family History  Problem Relation Age of Onset  . Alzheimer's disease Father   .  Cancer Father   . Breast cancer Neg Hx     Social History: Social History   Socioeconomic History  . Marital status: Married    Spouse name: Not on file  . Number of children: Not on file  . Years of education: Not on file  . Highest education level: Not on file  Occupational History  . 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 Smoking status: Current Every Day Smoker    Packs/day: 0.50    Types: Cigarettes  . Smokeless tobacco: Never Used  Vaping Use  . Vaping Use: Some days  Substance and Sexual Activity  . Alcohol use: Yes    Comment: social  . Drug use: No  . Sexual activity: Yes    Partners: Male  Other Topics Concern  . Not on file  Social History Narrative   4 children - 20, 17, 68, 20.   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 Influenza Inj Mdck Quad Pf 05/14/2018  . Tdap 09/22/2015    Allergies: No Known Allergies  Current Medications: Current Outpatient Medications  Medication Sig Dispense Refill  . cyclobenzaprine (FLEXERIL) 10 MG tablet Take 0.5-1 tablets (5-10 mg total) by mouth at bedtime as needed for muscle spasms. 30 tablet 1  . escitalopram (LEXAPRO) 10 MG tablet Take 10 mg by mouth daily.    Marland Kitchen levothyroxine (SYNTHROID, LEVOTHROID) 112 MCG tablet TAKE 1 TABLET BY MOUTH DAILY 15 tablet 0  . valACYclovir (VALTREX) 1000 MG tablet Take 1 tablet (1,000 mg total) by mouth 3 (three) times daily. 21 tablet 0  . Vitamin D, Ergocalciferol, (DRISDOL) 1.25 MG (50000 UNIT) CAPS capsule Take 50,000 Units by mouth once a week.    . imiquimod (ALDARA) 5 % cream Apply topically 3 (three) times a week. (Patient not taking: No sig reported) 12 each 1   No current facility-administered medications for this visit.    General: negative for fevers, changes in weight or night sweats Skin: negative for changes in moles or sores or rash Eyes: negative for changes in vision HEENT: negative for change in hearing, tinnitus, voice changes Pulmonary: negative for dyspnea, orthopnea, productive cough, wheezing Cardiac: negative for palpitations, pain Gastrointestinal: negative for nausea, vomiting, constipation, diarrhea, hematemesis, hematochezia Genitourinary/Sexual: negative for dysuria, retention, hematuria, incontinence Ob/Gyn:  negative for abnormal bleeding, or pain Musculoskeletal: negative for pain, joint pain, back pain Hematology: negative for easy  bruising, abnormal bleeding Neurologic/Psych: negative for headaches, seizures, paralysis, weakness, numbness   Objective:  Physical Examination:  BP 129/66   Pulse 76   Temp 97.6 F (36.4 C)   Resp 18   Wt 228 lb 8 oz (103.6 kg)   LMP 03/09/2017   SpO2 98%   BMI 35.79 kg/m   Vitals:   12/15/20 1400  BP: 129/66  Pulse: 76  Resp: 18  Temp: 97.6 F (36.4 C)  SpO2: 98%     ECOG Performance Status: 1 - Symptomatic but completely ambulatory  GENERAL: Patient is a well appearing female in  no acute distress NODES:  No cervical, supraclavicular, axillary, or inguinal lymphadenopathy palpated.  LUNGS:  Clear to auscultation bilaterally.  No wheezes or rhonchi. HEART:  Regular rate and rhythm. No murmur appreciated. ABDOMEN:  Soft, nontender.  Positive, normoactive bowel sounds.  MSK:  No focal spinal tenderness to palpation. Full range of motion bilaterally in the upper extremities. EXTREMITIES:  No peripheral edema.   SKIN:  Clear with no obvious rashes or skin changes. No nail dyscrasia. NEURO:  Nonfocal. Well oriented.  Appropriate affect.  Pelvic: Chaperoned by Nursing EGBUS: Area about 3 cm in diameter on left lower vulva healing well.  It has re-epithelialized.  No new lesions seen.  Assessment:  Angel Hernandez is a 50 y.o. 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  No concern for invasion.  No other lesions on colposcopy.  11/12/20 had laser of left vulvar VIN3 in [facility] clinic.  Healed well now.   Also develops boils in vulva occasionally.  None now.    Hysterectomy 2018 for AUB.   Medical co-morbidities complicating care: 1/2 PPD smoker Plan:   Problem List Items Addressed This Visit      Genitourinary   VIN III (vulvar intraepithelial neoplasia III) - Primary     Patient will follow up with [facility] clinic Ob/Gyn in 8 months.  We can see her back should the need arise in the future.   Mellody Drown, MD  CC:  Owens Loffler, MD [address] [place],  [place] [address] [phone]

## 2020-12-15 NOTE — Patient Instructions (Signed)
Please follow up with Dr. Leafy Ro in approximately 8 months (around January 2023). It was a pleasure seeing you today and thank you for allowing [place] to participate in your care. -Dr. Fransisca Connors & Beckey Rutter, NP

## 2021-02-05 ENCOUNTER — Encounter: Payer: Self-pay | Admitting: Emergency Medicine

## 2021-02-05 ENCOUNTER — Ambulatory Visit
Admission: EM | Admit: 2021-02-05 | Discharge: 2021-02-05 | Disposition: A | Discharge status: Home / Self Care | Payer: BC Managed Care – PPO

## 2021-02-05 ENCOUNTER — Other Ambulatory Visit: Payer: Self-pay

## 2021-02-05 DIAGNOSIS — J3489 Other specified disorders of nose and nasal sinuses: Secondary | ICD-10-CM

## 2021-02-05 DIAGNOSIS — R0981 Nasal congestion: Secondary | ICD-10-CM | POA: Diagnosis not present

## 2021-02-05 NOTE — ED Provider Notes (Signed)
[facility]    CSN: [id number] Arrival date & time: 02/05/21  1439      History   Chief Complaint Chief Complaint  Patient presents with   nose pain    HPI Angel Hernandez is a 50 y.o. female presenting for approximately 5-day history of nasal pain.  She also admits to some clear nasal drainage.  Patient states that she thought it had resolved but since being in the clinic today it seems to have returned.  She denies any fever, fatigue, sinus pain, ear pain, sore throat or cough.  No sick contacts or known exposure to COVID-19.  Patient declines COVID testing.  She says that she was previously taking Allegra but when her nasal pain started she stopped taking the Allegra.  She has not used any nasal sprays.  She denies any other complaints or concerns.  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/04/2013   Hypothyroid    Tobacco abuse    Thyroid cancer [facility])     Past Surgical History:  Procedure Laterality Date   BILATERAL SALPINGECTOMY Bilateral 03/19/2017   Procedure: BILATERAL SALPINGECTOMY;  Surgeon: Maceo Pro, MD;  Location: [facility] ORS;  Service: Gynecology;  Laterality: Bilateral;   CESAREAN SECTION     CYSTOSCOPY N/A 03/19/2017   Procedure: CYSTOSCOPY;  Surgeon: Ward, Honor Loh, MD;  Location: [facility] ORS;  Service: Gynecology;  Laterality: N/A;   DILATION AND CURETTAGE OF UTERUS     KNEE ARTHROSCOPY Left    thyroidectomy  2001   VAGINAL HYSTERECTOMY N/A 03/19/2017   Procedure: HYSTERECTOMY VAGINAL;   Surgeon: Ward, Honor Loh, MD;  Location: [facility] ORS;  Service: Gynecology;  Laterality: N/A;    OB History     Gravida  4   Para  4   Term  4   Preterm      AB      Living  4      SAB      IAB      Ectopic      Multiple      Live Births               Home Medications    Prior to Admission medications   Medication Sig Start Date End Date Taking? Authorizing Provider  escitalopram (LEXAPRO) 10 MG tablet Take 10 mg by mouth daily.   Yes [provider]  levothyroxine (SYNTHROID, LEVOTHROID) 112 MCG tablet TAKE 1 TABLET BY MOUTH DAILY 11/21/16  Yes Copland, Frederico Hamman, MD  Vitamin D, Ergocalciferol, (DRISDOL) 1.25 MG (50000 UNIT) CAPS capsule Take 50,000 Units by mouth once a week. 05/31/20  Yes [provider]  cyclobenzaprine (FLEXERIL) 10 MG tablet Take 0.5-1 tablets (5-10 mg total) by mouth at bedtime as needed for muscle spasms. 06/03/20 06/03/21  Copland, Frederico Hamman, MD  imiquimod [place]) 5 % cream Apply topically 3 (three) times a week. Patient not taking: No sig reported 06/04/20   Copland, Frederico Hamman, MD  valACYclovir (VALTREX) 1000 MG tablet Take  1 tablet (1,000 mg total) by mouth 3 (three) times daily. 09/02/19   Jinny Sanders, MD    Family History Family History  Problem Relation Age of Onset   Alzheimer's disease Father    Cancer Father    Breast cancer Neg Hx     Social History Social History   Tobacco Use   Smoking status: Every Day    Packs/day: 0.50    Pack years: 0.00    Types: Cigarettes   Smokeless tobacco: Never  Vaping Use   Vaping Use: Former  Substance Use Topics   Alcohol use: Yes    Comment: social   Drug use: No     Allergies   Patient has no known allergies.   Review of Systems Review of Systems  Constitutional:  Negative for chills, diaphoresis, fatigue and fever.  HENT:  Positive for congestion and rhinorrhea. Negative for ear pain, nosebleeds, sinus pressure, sinus pain and sore throat.   Respiratory:   Negative for cough.   Skin:  Negative for color change, rash and wound.  Neurological:  Negative for weakness and headaches.  Hematological:  Negative for adenopathy.    Physical Exam Triage Vital Signs ED Triage Vitals  Enc Vitals Group     BP 02/05/21 1454 123/68     Pulse Rate 02/05/21 1454 67     Resp 02/05/21 1454 14     Temp 02/05/21 1454 98.3 F (36.8 C)     Temp Source 02/05/21 1454 Oral     SpO2 02/05/21 1454 99 %     Weight 02/05/21 1451 226 lb (102.5 kg)     Height 02/05/21 1451 5\' 7"  (1.702 m)     Head Circumference --      Peak Flow --      Pain Score 02/05/21 1450 7     Pain Loc --      Pain Edu? --      Excl. in [place]? --    No data found.  Updated Vital Signs BP 123/68 (BP Location: Right Arm)   Pulse 67   Temp 98.3 F (36.8 C) (Oral)   Resp 14   Ht 5\' 7"  (1.702 m)   Wt 226 lb (102.5 kg)   LMP 03/09/2017   SpO2 99%   BMI 35.40 kg/m       Physical Exam Vitals and nursing note reviewed.  Constitutional:      General: She is not in acute distress.    Appearance: Normal appearance. She is not ill-appearing or toxic-appearing.  HENT:     Head: Normocephalic and atraumatic.     Nose: Nasal tenderness (TTP nasal bridge. No erythema), mucosal edema, congestion and rhinorrhea (trace clear drainage) present. Rhinorrhea is clear.     Right Nostril: No foreign body or epistaxis.     Left Nostril: No foreign body or epistaxis.     Mouth/Throat:     Mouth: Mucous membranes are moist.     Pharynx: Oropharynx is clear.  Eyes:     General: No scleral icterus.       Right eye: No discharge.        Left eye: No discharge.     Conjunctiva/sclera: Conjunctivae normal.  Cardiovascular:     Rate and Rhythm: Normal rate and regular rhythm.     Heart sounds: Normal heart sounds.  Pulmonary:     Effort: Pulmonary effort is normal. No respiratory distress.     Breath sounds: Normal breath sounds.  Musculoskeletal:  Cervical back: Neck supple.  Skin:     General: Skin is dry.  Neurological:     General: No focal deficit present.     Mental Status: She is alert. Mental status is at baseline.     Motor: No weakness.     Gait: Gait normal.  Psychiatric:        Mood and Affect: Mood normal.        Behavior: Behavior normal.        Thought Content: Thought content normal.     UC Treatments / Results  Labs (all labs ordered are listed, but only abnormal results are displayed) Labs Reviewed - No data to display  EKG   Radiology No results found.  Procedures Procedures (including critical care time)  Medications Ordered in UC Medications - No data to display  Initial Impression / Assessment and Plan / UC Course  I have reviewed the triage vital signs and the nursing notes.  Pertinent labs & imaging results that were available during my care of the patient were reviewed by me and considered in my medical decision making (see chart for details).  50 year old female presenting for nasal pain and clear drainage for the past 5 days.  Exam does not reveal any lesions or erythema of the nose.  She does have nasal mucosal edema and congestion.  Bridge of nose is tender to palpation.  The remainder of the exam is within normal limits.  Advised patient to start Sudafed and Flonase.  Advised use of humidifier as well.  Advised to follow-up as needed for any worsening symptoms.  Final Clinical Impressions(s) / UC Diagnoses   Final diagnoses:  Nasal pain  Nasal congestion     Discharge Instructions      Start Sudafed and use of Flonase.  You can also consider use of nasal saline and Afrin.  Increase rest and fluids.  If you develop a fever, sinus pain, ear pain or worsening of symptoms you should be seen again by PCP.  If you cannot see PCP then you may return to our clinic.     ED Prescriptions   None    PDMP not reviewed this encounter.   Danton Clap, PA-C 02/05/21 1514

## 2021-02-05 NOTE — ED Triage Notes (Signed)
Patient c/o pain across the bridge of her nose that started 5 days ago.  Patient reports clear nasal drainage that has since resolved. Patient denies fevers.

## 2021-02-05 NOTE — Discharge Instructions (Addendum)
Start Sudafed and use of Flonase.  You can also consider use of nasal saline and Afrin.  Increase rest and fluids.  If you develop a fever, sinus pain, ear pain or worsening of symptoms you should be seen again by PCP.  If you cannot see PCP then you may return to our clinic.

## 2021-02-10 ENCOUNTER — Other Ambulatory Visit: Payer: Self-pay

## 2021-02-10 ENCOUNTER — Encounter: Payer: Self-pay | Admitting: Family Medicine

## 2021-02-10 ENCOUNTER — Ambulatory Visit: Payer: BC Managed Care – PPO | Admitting: Family Medicine

## 2021-02-10 VITALS — BP 120/80 | HR 70 | Temp 98.2°F | Ht 67.0 in | Wt 228.8 lb

## 2021-02-10 DIAGNOSIS — F3342 Major depressive disorder, recurrent, in full remission: Secondary | ICD-10-CM

## 2021-02-10 DIAGNOSIS — G8929 Other chronic pain: Secondary | ICD-10-CM

## 2021-02-10 DIAGNOSIS — M549 Dorsalgia, unspecified: Secondary | ICD-10-CM

## 2021-02-10 MED ORDER — VITAMIN D (ERGOCALCIFEROL) 1.25 MG (50000 UNIT) PO CAPS
50000.0000 [IU] | ORAL_CAPSULE | ORAL | 1 refills | Status: DC
Start: 2021-02-10 — End: 2021-08-09

## 2021-02-10 MED ORDER — ESCITALOPRAM OXALATE 10 MG PO TABS: 10.0000 mg | ORAL_TABLET | Freq: Every day | ORAL | 3 refills | Status: DC

## 2021-02-10 MED ORDER — TIZANIDINE HCL 4 MG PO TABS: 2.0000 mg | ORAL_TABLET | Freq: Every evening | ORAL | 2 refills | Status: DC | PRN

## 2021-02-10 MED ORDER — CELECOXIB 200 MG PO CAPS: 200.0000 mg | ORAL_CAPSULE | Freq: Every day | ORAL | 2 refills | Status: DC

## 2021-02-10 NOTE — Progress Notes (Signed)
Angel Ohagan T. Zair Borawski, MD, [place] at [facility] [place], [address]  Phone: [phone]  FAX: [place] - 50 y.o. female  MRN [id number]  Date of Birth: September 11, 1970  Date: 02/10/2021  PCP: Owens Loffler, MD  Referral: Owens Loffler, MD  Chief Complaint  Patient presents with   Medication Refill    Lexapro & Vit D   Back Pain    This visit occurred during the SARS-CoV-2 public health emergency.  Safety protocols were in place, including screening questions prior to the visit, additional usage of staff PPE, and extensive cleaning of exam room while observing appropriate contact time as indicated for disinfecting solutions.   Subjective:   Angel Hernandez is a 50 y.o. very pleasant female patient with Body mass index is 35.83 kg/m. who presents with the following:  Refill Lexapro and chronically low d with 50,000 units.  Chronic back pain.  This is been an ongoing issue for years before Wednesday. Did do some PT for about 2 months. Did go well for her back. She is had a exacerbation right now, she is on her feet at work all the time.  Doing home PT now.  With certain back rehab, will feel like she is having cramps.  Not pleasant at all. She does not describe any numbness, tingling or radicular symptoms.  Has a new dog.  Lowest part of her low back and all the way across and into the front as well Will have pain - basically four or five days a week.  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   Review of Systems is noted in the HPI, as appropriate  Objective:   BP 120/80   Pulse 70   Temp 98.2 F (36.8 C) (Temporal)   Ht 5\' 7"  (1.702 m)   Wt 228 lb 12 oz (103.8 kg)   LMP 03/09/2017   SpO2 97%   BMI 35.83 kg/m    Range of motion at  the waist: Flexion: normal Extension: normal Lateral bending: normal Rotation: all normal  No echymosis or  edema Rises to examination table with no difficulty Gait: non antalgic  Inspection/Deformity: N Paraspinus Tenderness: L3-S1 bilaterally  B Ankle Dorsiflexion (L5,4): 5/5 B Great Toe Dorsiflexion (L5,4): 5/5 Heel Walk (L5): WNL Toe Walk (S1): WNL Rise/Squat (L4): WNL  SENSORY B Medial Foot (L4): WNL B Dorsum (L5): WNL B Lateral (S1): WNL Light Touch: WNL Pinprick: WNL  REFLEXES Knee (L4): 2+ Ankle (S1): 2+  B SLR, seated: neg B SLR, supine: neg B FABER: neg B Reverse FABER: neg B Greater Troch: NT B Log Roll: neg B Sciatic Notch: NT   Laboratory and Imaging Data:  Assessment and Plan:     ICD-10-CM   1. Chronic back pain greater than 3 months duration  M54.9    G89.29     2. Recurrent major depression in full remission ([place])  F33.42      I think Kadi is going to have back pain intermittently off and on indefinitely.  Chronic back pain with exacerbation.  I encouraged her to keep doing her home rehab.  I am going to give her some Celebrex to try daily for now, and then use as needed.  If she finds significant relief from this then I think daily use is also entirely reasonable.  I am also going to give her some Zanaflex at nighttime to  see if this helps with her ongoing pain.  Refill Lexapro, currently stable. Refill vitamin D.  Meds ordered this encounter  Medications   Vitamin D, Ergocalciferol, (DRISDOL) 1.25 MG (50000 UNIT) CAPS capsule    Sig: Take 1 capsule (50,000 Units total) by mouth once a week.    Dispense:  12 capsule    Refill:  1   escitalopram (LEXAPRO) 10 MG tablet    Sig: Take 1 tablet (10 mg total) by mouth daily.    Dispense:  90 tablet    Refill:  3   celecoxib (CELEBREX) 200 MG capsule    Sig: Take 1 capsule (200 mg total) by mouth daily.    Dispense:  30 capsule    Refill:  2   tiZANidine (ZANAFLEX) 4 MG tablet    Sig: Take 0.5 tablets (2 mg total) by mouth at bedtime as needed for muscle spasms.    Dispense:  30 tablet     Refill:  2   Medications Discontinued During This Encounter  Medication Reason   imiquimod (ALDARA) 5 % cream Completed Course   escitalopram (LEXAPRO) 10 MG tablet Reorder   Vitamin D, Ergocalciferol, (DRISDOL) 1.25 MG (50000 UNIT) CAPS capsule Reorder   cyclobenzaprine (FLEXERIL) 10 MG tablet    No orders of the defined types were placed in this encounter.   Follow-up: No follow-ups on file.  Signed,  Maud Deed. Erynne Kealey, MD   Outpatient Encounter Medications as of 02/10/2021  Medication Sig   celecoxib (CELEBREX) 200 MG capsule Take 1 capsule (200 mg total) by mouth daily.   levothyroxine (SYNTHROID, LEVOTHROID) 112 MCG tablet TAKE 1 TABLET BY MOUTH DAILY   tiZANidine (ZANAFLEX) 4 MG tablet Take 0.5 tablets (2 mg total) by mouth at bedtime as needed for muscle spasms.   valACYclovir (VALTREX) 1000 MG tablet Take 1 tablet (1,000 mg total) by mouth 3 (three) times daily.   [DISCONTINUED] cyclobenzaprine (FLEXERIL) 10 MG tablet Take 0.5-1 tablets (5-10 mg total) by mouth at bedtime as needed for muscle spasms.   [DISCONTINUED] escitalopram (LEXAPRO) 10 MG tablet Take 10 mg by mouth daily.   [DISCONTINUED] Vitamin D, Ergocalciferol, (DRISDOL) 1.25 MG (50000 UNIT) CAPS capsule Take 50,000 Units by mouth once a week.   escitalopram (LEXAPRO) 10 MG tablet Take 1 tablet (10 mg total) by mouth daily.   Vitamin D, Ergocalciferol, (DRISDOL) 1.25 MG (50000 UNIT) CAPS capsule Take 1 capsule (50,000 Units total) by mouth once a week.   [DISCONTINUED] imiquimod (ALDARA) 5 % cream Apply topically 3 (three) times a week. (Patient not taking: No sig reported)   No facility-administered encounter medications on file as of 02/10/2021.

## 2021-02-13 DIAGNOSIS — F331 Major depressive disorder, recurrent, moderate: Secondary | ICD-10-CM | POA: Insufficient documentation

## 2021-02-13 DIAGNOSIS — G8929 Other chronic pain: Secondary | ICD-10-CM | POA: Insufficient documentation

## 2021-02-13 DIAGNOSIS — F3342 Major depressive disorder, recurrent, in full remission: Secondary | ICD-10-CM | POA: Insufficient documentation

## 2021-04-15 ENCOUNTER — Other Ambulatory Visit: Payer: Self-pay | Admitting: *Deleted

## 2021-04-15 NOTE — Telephone Encounter (Signed)
Last office visit 02/10/2021 for chronic back pain/depression.  Last refilled 02/10/2021 for #30 with 2 refills.  Pharmacy is requesting future refills.  No future appointments.

## 2021-04-17 MED ORDER — CELECOXIB 200 MG PO CAPS: 200.0000 mg | ORAL_CAPSULE | Freq: Every day | ORAL | 2 refills | Status: DC

## 2021-06-29 ENCOUNTER — Encounter: Payer: Self-pay | Admitting: Licensed Clinical Social Worker

## 2021-06-29 ENCOUNTER — Other Ambulatory Visit: Payer: Self-pay

## 2021-06-29 ENCOUNTER — Ambulatory Visit
Admission: EM | Admit: 2021-06-29 | Discharge: 2021-06-29 | Disposition: A | Discharge status: Home / Self Care | Payer: BC Managed Care – PPO | Attending: Emergency Medicine | Admitting: Emergency Medicine

## 2021-06-29 DIAGNOSIS — R42 Dizziness and giddiness: Secondary | ICD-10-CM

## 2021-06-29 DIAGNOSIS — J0100 Acute maxillary sinusitis, unspecified: Secondary | ICD-10-CM

## 2021-06-29 MED ORDER — AMOXICILLIN-POT CLAVULANATE 875-125 MG PO TABS: 1.0000 | ORAL_TABLET | Freq: Two times a day (BID) | ORAL | 0 refills | Status: DC

## 2021-06-29 MED ORDER — MECLIZINE HCL 12.5 MG PO TABS: 12.5000 mg | ORAL_TABLET | Freq: Three times a day (TID) | ORAL | 0 refills | Status: DC | PRN

## 2021-06-29 NOTE — ED Triage Notes (Signed)
Pt c/o of vertigo x 1 month unable to put words together,  sore throat, bloody runny nose. Pt saw tele doc through her insurance and is treating her for sinusitis. She was taking doxycyline. Pt c/o of left side facial pain, blurry vision.

## 2021-06-29 NOTE — ED Provider Notes (Signed)
[facility]    CSN: [id number] Arrival date & time: 06/29/21  1029      History   Chief Complaint Chief Complaint  Patient presents with   Sore Throat   Nasal Congestion    HPI Angel Hernandez is a 50 y.o. female.   Patient presents with nasal congestion, and bilateral ear pain and fullness, rhinorrhea, nosebleeds, sinus pressure predominantly on the left side, nonproductive cough and intermittent dizziness worsened while at work for 1 month.  Endorses that symptoms are just not improving . being treated for sinus infection with doxycyline, has three days left, felt like systems were improved but have now worsened. Dizziness, feels as if she is moving, worsened with changing positions. History of GERD, Thyroid cancer, migraines, tobacco use. Known sick contacts.   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   Hypothyroid    Tobacco abuse    Thyroid cancer [facility])     Past Surgical History:  Procedure Laterality Date   BILATERAL SALPINGECTOMY Bilateral 03/19/2017   Procedure: BILATERAL SALPINGECTOMY;  Surgeon: Ward, Honor Loh, MD;  Location: [facility] ORS;  Service: Gynecology;  Laterality: Bilateral;   CESAREAN SECTION     CYSTOSCOPY N/A 03/19/2017   Procedure: CYSTOSCOPY;  Surgeon: Ward, Honor Loh, MD;  Location: [facility] OR[facility];  Service: Gynecology;  Laterality: N/A;   DILATION AND CURETTAGE OF UTERUS     KNEE ARTHROSCOPY Left    thyroidectomy  2001   VAGINAL HYSTERECTOMY N/A 03/19/2017   Procedure: HYSTERECTOMY VAGINAL;  Surgeon: Ward, Honor Loh, MD;  Location: [facility] OR[facility];  Service: Gynecology;  Laterality: N/A;     OB History     Gravida  4   Para  4   Term  4   Preterm      AB      Living  4      SAB      IAB      Ectopic      Multiple      Live Births               Home Medications    Prior to Admission medications   Medication Sig Start Date End Date Taking? Authorizing Provider  escitalopram (LEXAPRO) 10 MG tablet Take 1 tablet (10 mg total) by mouth daily. 02/10/21  Yes Copland, Frederico Hamman, MD  levothyroxine (SYNTHROID, LEVOTHROID) 112 MCG tablet TAKE 1 TABLET BY MOUTH DAILY 11/21/16  Yes Copland, Frederico Hamman, MD  Vitamin D, Ergocalciferol, (DRISDOL) 1.25 MG (50000 UNIT) CAPS capsule Take 1 capsule (50,000 Units total) by mouth once a week. 02/10/21  Yes Copland, Frederico Hamman, MD  celecoxib (CELEBREX) 200 MG capsule Take 1 capsule (200 mg total) by mouth daily. 04/17/21   Copland, Frederico Hamman, MD  tiZANidine (ZANAFLEX) 4 MG tablet Take 0.5 tablets (2 mg total) by mouth at bedtime as needed for muscle spasms. 02/10/21   Copland, Frederico Hamman, MD  valACYclovir (VALTREX) 1000 MG tablet Take 1 tablet (1,000 mg total) by mouth 3 (three) times daily. 09/02/19   Jinny Sanders, MD    Family History Family History  Problem  Relation Age of Onset   Alzheimer's disease Father    Cancer Father    Breast cancer Neg Hx     Social History Social History   Tobacco Use   Smoking status: Every Day    Packs/day: 0.50    Types: Cigarettes   Smokeless tobacco: Never  Vaping Use   Vaping Use: Former  Substance Use Topics   Alcohol use: Yes    Comment: social   Drug use: No     Allergies   Patient has no known allergies.   Review of Systems Review of Systems  Constitutional:  Negative for activity change, appetite change, chills, diaphoresis, fatigue, fever and unexpected weight change.  HENT:  Positive for congestion, ear pain, nosebleeds, rhinorrhea and sinus pressure. Negative for dental problem, drooling, ear discharge, facial swelling, hearing loss, mouth sores, postnasal drip, sinus  pain, sneezing, sore throat, tinnitus, trouble swallowing and voice change.   Respiratory:  Positive for cough. Negative for apnea, choking, chest tightness, shortness of breath, wheezing and stridor.   Cardiovascular: Negative.   Gastrointestinal: Negative.   Skin: Negative.   Neurological:  Positive for dizziness. Negative for tremors, seizures, syncope, facial asymmetry, speech difficulty, weakness, light-headedness, numbness and headaches.    Physical Exam Triage Vital Signs ED Triage Vitals  Enc Vitals Group     BP 06/29/21 1222 129/70     Pulse Rate 06/29/21 1222 77     Resp 06/29/21 1222 16     Temp 06/29/21 1222 98.4 F (36.9 C)     Temp Source 06/29/21 1222 Oral     SpO2 06/29/21 1222 100 %     Weight 06/29/21 1221 228 lb 13.4 oz (103.8 kg)     Height 06/29/21 1221 5\' 7"  (1.702 m)     Head Circumference --      Peak Flow --      Pain Score 06/29/21 1221 2     Pain Loc --      Pain Edu? --      Excl. in [place]? --    No data found.  Updated Vital Signs BP 129/70 (BP Location: Left Arm)   Pulse 77   Temp 98.4 F (36.9 C) (Oral)   Resp 16   Ht 5\' 7"  (1.702 m)   Wt 228 lb 13.4 oz (103.8 kg)   LMP 03/09/2017   SpO2 100%   BMI 35.84 kg/m   Visual Acuity Right Eye Distance:   Left Eye Distance:   Bilateral Distance:    Right Eye Near:   Left Eye Near:    Bilateral Near:     Physical Exam Constitutional:      Appearance: Normal appearance. She is well-developed and normal weight.  HENT:     Head: Normocephalic.     Right Ear: Hearing, ear canal and external ear normal. A middle ear effusion is present.     Left Ear: Hearing, ear canal and external ear normal. A middle ear effusion is present.     Nose: Congestion and rhinorrhea present.     Right Sinus: Maxillary sinus tenderness present. No frontal sinus tenderness.     Left Sinus: Maxillary sinus tenderness present. No frontal sinus tenderness.     Mouth/Throat:     Mouth: Mucous membranes are moist.      Pharynx: Oropharynx is clear.  Eyes:     Extraocular Movements: Extraocular movements intact.  Cardiovascular:     Rate and Rhythm: Normal rate and regular rhythm.  Pulses: Normal pulses.     Heart sounds: Normal heart sounds.  Pulmonary:     Effort: Pulmonary effort is normal.     Breath sounds: Normal breath sounds.  Musculoskeletal:        General: Normal range of motion.     Cervical back: Normal range of motion and neck supple.  Skin:    General: Skin is warm and dry.  Neurological:     Mental Status: She is alert and oriented to person, place, and time. Mental status is at baseline.  Psychiatric:        Mood and Affect: Mood normal.        Behavior: Behavior normal.     UC Treatments / Results  Labs (all labs ordered are listed, but only abnormal results are displayed) Labs Reviewed - No data to display  EKG   Radiology No results found.  Procedures Procedures (including critical care time)  Medications Ordered in UC Medications - No data to display  Initial Impression / Assessment and Plan / UC Course  I have reviewed the triage vital signs and the nursing notes.  Pertinent labs & imaging results that were available during my care of the patient were reviewed by me and considered in my medical decision making (see chart for details).  Acute nonrecurrent maxillary sinusitis Dizziness  Vital signs are stable, patient in no signs of distress, encourage patient to continue and complete course of doxycycline then begin Augmentin for 1 week with follow-up with PCP if symptoms do not improve, prescribed meclizine for dizziness most likely related to the middle ear effusion, recommended use of Mucinex however patient endorses that last time she used this medication she had pleurisy of the lungs, advised beginning use of Flonase, in agreement with plan of care Final Clinical Impressions(s) / UC Diagnoses   Final diagnoses:  None   Discharge Instructions    None    ED Prescriptions   None    PDMP not reviewed this encounter.   Hans Eden, NP 06/29/21 1308

## 2021-06-29 NOTE — Discharge Instructions (Signed)
You are being treated for a sinus infection, finish your course of doxycycline, then begin taking Augmentin twice a day for the next 7 days  Begin using Flonase to try to help clear your sinus passageways  You may use meclizine 3 times a day as needed for dizziness, if dizziness persist after other symptoms of clear please follow-up with your primary care doctor for further evaluation    You can take Tylenol and/or Ibuprofen as needed for fever reduction and pain relief.   For cough: honey 1/2 to 1 teaspoon (you can dilute the honey in water or another fluid).  You can also use guaifenesin and dextromethorphan for cough. You can use a humidifier for chest congestion and cough.  If you don't have a humidifier, you can sit in the bathroom with the hot shower running.      For sore throat: try warm salt water gargles, cepacol lozenges, throat spray, warm tea or water with lemon/honey, popsicles or ice, or OTC cold relief medicine for throat discomfort.   For congestion: take a daily anti-histamine like Zyrtec, Claritin, and a oral decongestant, such as pseudoephedrine.  You can also use Flonase 1-2 sprays in each nostril daily.   It is important to stay hydrated: drink plenty of fluids (water, gatorade/powerade/pedialyte, juices, or teas) to keep your throat moisturized and help further relieve irritation/discomfort.

## 2021-08-09 ENCOUNTER — Other Ambulatory Visit: Payer: Self-pay | Admitting: Family Medicine

## 2021-08-09 NOTE — Telephone Encounter (Signed)
Last office visit 02/10/2021 for Med refill & back Pain.  Last refilled 02/10/2021 for #12 with 1 refill.  Last Vit D level-None on File.  No future appointments.  Refill?

## 2021-09-09 ENCOUNTER — Other Ambulatory Visit: Payer: Self-pay | Admitting: Family Medicine

## 2021-09-09 NOTE — Telephone Encounter (Signed)
Last office visit 02/10/2021 for Med Refill and Back Pain.  Last refilled 02/10/2021 for #30 with 2 refills.  Next Appt: 09/21/21 for med refill.

## 2021-09-21 ENCOUNTER — Other Ambulatory Visit: Payer: Self-pay

## 2021-09-21 ENCOUNTER — Encounter: Payer: Self-pay | Admitting: Family Medicine

## 2021-09-21 ENCOUNTER — Ambulatory Visit: Payer: BC Managed Care – PPO | Admitting: Family Medicine

## 2021-09-21 VITALS — BP 110/80 | HR 76 | Temp 98.7°F | Ht 67.0 in | Wt 224.3 lb

## 2021-09-21 DIAGNOSIS — R42 Dizziness and giddiness: Secondary | ICD-10-CM

## 2021-09-21 DIAGNOSIS — E890 Postprocedural hypothyroidism: Secondary | ICD-10-CM | POA: Diagnosis not present

## 2021-09-21 DIAGNOSIS — F339 Major depressive disorder, recurrent, unspecified: Secondary | ICD-10-CM

## 2021-09-21 MED ORDER — MUPIROCIN 2 % EX OINT: 1.0000 "application " | TOPICAL_OINTMENT | Freq: Three times a day (TID) | CUTANEOUS | 0 refills | Status: DC

## 2021-09-21 MED ORDER — ESCITALOPRAM OXALATE 10 MG PO TABS: 10.0000 mg | ORAL_TABLET | Freq: Every day | ORAL | 3 refills | Status: DC

## 2021-09-21 NOTE — Progress Notes (Signed)
Angel Hernandez T. Angel Tollett, MD, [place] at [facility] [place], [address]  Phone: [phone]   FAX: [place] - 51 y.o. female   MRN [id number]   Date of Birth: Apr 02, 1971  Date: 09/21/2021   PCP: Owens Loffler, MD   Referral: Owens Loffler, MD  Chief Complaint  Patient presents with   Medication Refill    This visit occurred during the SARS-CoV-2 public health emergency.  Safety protocols were in place, including screening questions prior to the visit, additional usage of staff PPE, and extensive cleaning of exam room while observing appropriate contact time as indicated for disinfecting solutions.   Subjective:   Angel Hernandez is a 51 y.o. very pleasant female patient with Body mass index is 35.13 kg/m. who presents with the following:  She is here for medication follow-up.  She does have hypothyroidism.  [facility] - follows thyroid for her.  Full lab evals, she is really here for a few different things.  She has been having some problems with her neck and back for a long time.  She also does describe a sensations of a shock feeling in the neck.  This been going on for quite some time.  It does happen reasonably frequency.  Shock sensations:  sometimes will come from the neck up.  It will not hurt.   Also has some neck pain.  There is also some pain in the neck.  Also has been having some vertigo.  Can be sittig and when looking to the left.  Can last 5 mins - has been going on for months.  Vertigo has been present ongoing for some time.  She cannot think of anything really that instigated it.  She also has a sore in her nose that has been bothering her.  She has been using some Neosporin, has not been really been resolving  Lipo today -   She also does have a history of depression.  This is flared up again somewhat.  She has had some depression off and on in the past.  Sleep is disturbed  little bit and she has had some issues with her interest level.  So with her energy level.  Review of Systems is noted in the HPI, as appropriate  Objective:   BP 110/80    Pulse 76    Temp 98.7 F (37.1 C) (Oral)    Ht 5\' 7"  (1.702 m)    Wt 224 lb 5 oz (101.7 kg)    LMP 03/09/2017    SpO2 97%    BMI 35.13 kg/m   GEN: No acute distress; alert,appropriate. PULM: Breathing comfortably in no respiratory distress PSYCH: Normally interactive.  CV: RRR, no m/g/r   Laboratory and Imaging Data:  Assessment and Plan:     ICD-10-CM   1. Vertigo  R42 PT vestibular rehab    2. 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 Depression, recurrent (HCC)  F33.9      Significant vertigo ongoing for months, i am gonna have the patient do some vestibular therapy.  Recurrent depression, restart Lexapro.  Nasal sore, initiate Bactroban.  Meds ordered this encounter  Medications   mupirocin ointment (BACTROBAN) 2 %    Sig: Apply 1 application topically 3 (three) times daily.    Dispense:  22 g    Refill:  0   escitalopram (LEXAPRO) 10 MG tablet    Sig: Take 1 tablet (  10 mg total) by mouth daily.    Dispense:  90 tablet    Refill:  3   Medications Discontinued During This Encounter  Medication Reason   amoxicillin-clavulanate (AUGMENTIN) 875-125 MG tablet Completed Course   tiZANidine (ZANAFLEX) 4 MG tablet Completed Course   escitalopram (LEXAPRO) 10 MG tablet Reorder   Orders Placed This Encounter  Procedures   PT vestibular rehab    Follow-up: No follow-ups on file.  [employer] One speech-to-text software was used for transcription in this dictation.  Possible transcriptional errors can occur using Editor, commissioning.   Signed,  Maud Deed. Cale Decarolis, MD   Outpatient Encounter Medications as of 09/21/2021  Medication Sig   levothyroxine (SYNTHROID, LEVOTHROID) 112 MCG tablet TAKE 1 TABLET BY MOUTH DAILY   meclizine (ANTIVERT) 12.5 MG tablet Take 1 tablet (12.5 mg total) by  mouth 3 (three) times daily as needed for dizziness.   mupirocin ointment (BACTROBAN) 2 % Apply 1 application topically 3 (three) times daily.   valACYclovir (VALTREX) 1000 MG tablet Take 1 tablet (1,000 mg total) by mouth 3 (three) times daily.   Vitamin D, Ergocalciferol, (DRISDOL) 1.25 MG (50000 UNIT) CAPS capsule TAKE 1 CAPSULE BY MOUTH 1 TIME A WEEK   [DISCONTINUED] escitalopram (LEXAPRO) 10 MG tablet Take 1 tablet (10 mg total) by mouth daily.   escitalopram (LEXAPRO) 10 MG tablet Take 1 tablet (10 mg total) by mouth daily.   [DISCONTINUED] amoxicillin-clavulanate (AUGMENTIN) 875-125 MG tablet Take 1 tablet by mouth every 12 (twelve) hours.   [DISCONTINUED] tiZANidine (ZANAFLEX) 4 MG tablet TAKE 1/2 TABLET(2 MG) BY MOUTH AT BEDTIME AS NEEDED FOR MUSCLE SPASMS   No facility-administered encounter medications on file as of 09/21/2021.

## 2022-02-12 ENCOUNTER — Other Ambulatory Visit: Payer: Self-pay | Admitting: Family Medicine

## 2022-03-01 ENCOUNTER — Telehealth: Payer: Self-pay | Admitting: Family Medicine

## 2022-03-01 MED ORDER — ESCITALOPRAM OXALATE 10 MG PO TABS: 10.0000 mg | ORAL_TABLET | Freq: Every day | ORAL | 1 refills | Status: DC

## 2022-03-01 NOTE — Telephone Encounter (Signed)
Patient called in stating that the pharmacy said that her prescription forescitalopram (LEXAPRO) 10 MG tablet has been closed out and she would like to know why? She would like a phone call,and states that if she need sot come in for an appointment to get this medication refilled,that would be fine.

## 2022-03-01 NOTE — Telephone Encounter (Signed)
Spoke with Angel Hernandez. She states [facility] told her that her prescription for her Lexapro had been cancelled.  I let her know that we had refilled her medication back on 09/21/21 for #90 with 3 refills so if her prescription was cancelled in was done by [facility].  I told her I would go ahead and send in a new refill to her pharmacy.

## 2022-08-25 ENCOUNTER — Ambulatory Visit: Payer: BC Managed Care – PPO | Admitting: Nurse Practitioner

## 2022-08-25 ENCOUNTER — Other Ambulatory Visit: Payer: Self-pay | Admitting: Nurse Practitioner

## 2022-08-25 ENCOUNTER — Encounter: Payer: Self-pay | Admitting: Nurse Practitioner

## 2022-08-25 VITALS — BP 132/88 | HR 80 | Ht 67.0 in | Wt 209.0 lb

## 2022-08-25 DIAGNOSIS — R42 Dizziness and giddiness: Secondary | ICD-10-CM

## 2022-08-25 DIAGNOSIS — G4489 Other headache syndrome: Secondary | ICD-10-CM | POA: Diagnosis not present

## 2022-08-25 MED ORDER — FLUTICASONE PROPIONATE 50 MCG/ACT NA SUSP: 2.0000 | Freq: Every day | NASAL | 0 refills | Status: DC

## 2022-08-25 MED ORDER — KETOROLAC TROMETHAMINE 60 MG/2ML IM SOLN
60.0000 mg | Freq: Once | INTRAMUSCULAR | Status: AC
  Administered 2022-08-25: 60 mg via INTRAMUSCULAR

## 2022-08-25 NOTE — Progress Notes (Signed)
Acute Office Visit  Subjective:     Patient ID: Angel Hernandez, female    DOB: 1970-11-07, 52 y.o.   MRN: [id number]  Chief Complaint  Patient presents with   Dizziness    Off and on for several days; has been intermittent for >1year; meclizine helps some   Headache    For several days   Flushing    Feels face is flushed, new today   Hot Flashes    Armpits sweat a lot     Patient is in today for multiple complaints with a history of thyroid cancer, hypothyroidism, tobacco abuse, MDD  Dizziness: history of vertigo. States that it started today at 5am and feels like the whole room spins. States worse with head movement makes it worse. States she tried meclzine this morning. States she was at work and squatted down and then hit the dizziness and then she could not move for fear of more dizziness and vomting. States the meclizine did help. States that the dizziness did get better than headache. Then with movement more dizziness States that every time she gets the vertiog she gets flushing and hot flashes. States that she has to take some layers off and then has to fan herself. States that the flusihgn last 71mns. She will also feel light headed when it happened   Headache: States that it started after the dizziness and is right side frontal going down the to right cheek. States it is termed as a rubber band. States that it makes it harder for her to see the computer. 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 States when she has them now the pain is bi tempalar and the middle of the head. States the last time she had one was two . States that she takes some ibuprofen that will help. Sometimes they last 15 mins to 45. States that she does have light sensitivey. No Nausea and vomiting States a level 2/10 on the pain scale. dull rubber band sensation    Review of Systems  Constitutional:  Negative for chills and fever.  Eyes:  Positive for photophobia.  Negative for blurred vision and double vision.  Respiratory:  Negative for shortness of breath.   Cardiovascular:  Negative for chest pain.  Gastrointestinal:  Positive for nausea. Negative for abdominal pain and vomiting.  Neurological:  Positive for dizziness and headaches. Negative for tingling and weakness.        Objective:    BP 132/88   Pulse 80   Ht '5\' 7"'$  (1.702 m)   Wt 209 lb (94.8 kg)   LMP 03/09/2017   SpO2 99%   BMI 32.73 kg/m    Physical Exam Vitals and nursing note reviewed.  Constitutional:      Appearance: Normal appearance.  HENT:     Right Ear: Tympanic membrane, ear canal and external ear normal.     Left Ear: Tympanic membrane, ear canal and external ear normal.     Ears:     Comments: Fluid behind bilateral TMs    Mouth/Throat:     Mouth: Mucous membranes are moist.     Pharynx: Oropharynx is clear.  Eyes:     Extraocular Movements: Extraocular movements intact.     Pupils: Pupils are equal, round, and reactive to light.  Cardiovascular:     Rate and Rhythm: Normal rate and regular rhythm.     Heart sounds: Normal heart sounds.  Pulmonary:  Effort: Pulmonary effort is normal.     Breath sounds: Normal breath sounds.  Musculoskeletal:     Right lower leg: No edema.     Left lower leg: No edema.  Lymphadenopathy:     Cervical: No cervical adenopathy.  Skin:    General: Skin is warm.  Neurological:     General: No focal deficit present.     Mental Status: She is alert.     Cranial Nerves: Cranial nerves 2-12 are intact.     Sensory: Sensation is intact.     Motor: Motor function is intact.     Coordination: Coordination is intact. Finger-Nose-Finger Test normal.     Gait: Gait is intact.     Deep Tendon Reflexes:     Reflex Scores:      Bicep reflexes are 2+ on the right side and 2+ on the left side.      Patellar reflexes are 2+ on the right side and 2+ on the left side.    Comments: Bilateral upper and lower extremity strength 5/5      No results found for any visits on 08/25/22.      Assessment & Plan:   Problem List Items Addressed This Visit       Other   Other headache syndrome - Primary    Patient not taken any over-the-counter analgesics today.  Will administer Toradol IM 60 mg x 1 dose.  Patient avoid NSAIDs for the next 12 hours.  Curious if she is developing a new complex migraine with the dizziness and headache following after.  Patient does admit to having ocular migraines.  If no improvement she will follow-up with primary care provider      Dizziness    Peripheral in nature.  She does have fluid behind bilateral TMs.  Will do fluticasone nasal spray 2 sprays each day.  She continue taking meclizine 12.5 mg 3 times daily as needed.  Recommend rest drink plenty of fluid follow-up with primary care if no improvement.      Relevant Medications   fluticasone (FLONASE) 50 MCG/ACT nasal spray    Meds ordered this encounter  Medications   ketorolac (TORADOL) injection 60 mg   fluticasone (FLONASE) 50 MCG/ACT nasal spray    Sig: Place 2 sprays into both nostrils daily.    Dispense:  16 g    Refill:  0    Order Specific Question:   Supervising Provider    Answer:   TOWER, MARNE A [[id number]    Return if symptoms worsen or fail to improve.  Romilda Garret, NP

## 2022-08-25 NOTE — Assessment & Plan Note (Signed)
Peripheral in nature.  She does have fluid behind bilateral TMs.  Will do fluticasone nasal spray 2 sprays each day.  She continue taking meclizine 12.5 mg 3 times daily as needed.  Recommend rest drink plenty of fluid follow-up with primary care if no improvement.

## 2022-08-25 NOTE — Assessment & Plan Note (Signed)
Patient not taken any over-the-counter analgesics today.  Will administer Toradol IM 60 mg x 1 dose.  Patient avoid NSAIDs for the next 12 hours.  Curious if she is developing a new complex migraine with the dizziness and headache following after.  Patient does admit to having ocular migraines.  If no improvement she will follow-up with primary care provider

## 2022-08-25 NOTE — Patient Instructions (Signed)
Nice to see you today We gave you Toradol in office. Avoid ibuprofen and other NSAIDs for 12 hours. Tylenol is ok to take if needed Rest, drink plenty of fluid You can take the meclizine up to 3 times in a day as needed Follow up if no improvement

## 2022-08-27 ENCOUNTER — Telehealth: Payer: Self-pay | Admitting: Family Medicine

## 2022-08-27 NOTE — Telephone Encounter (Signed)
Please schedule CPE with fasting labs prior with Dr. Copland.  

## 2022-08-28 NOTE — Telephone Encounter (Signed)
Called patient but VM full, and sent MyChart message.

## 2022-08-28 NOTE — Telephone Encounter (Signed)
Patient scheduled.

## 2022-08-29 ENCOUNTER — Other Ambulatory Visit: Payer: Self-pay | Admitting: Family Medicine

## 2022-08-29 DIAGNOSIS — E890 Postprocedural hypothyroidism: Secondary | ICD-10-CM

## 2022-08-29 DIAGNOSIS — Z79899 Other long term (current) drug therapy: Secondary | ICD-10-CM

## 2022-08-29 DIAGNOSIS — Z1159 Encounter for screening for other viral diseases: Secondary | ICD-10-CM

## 2022-08-29 DIAGNOSIS — E782 Mixed hyperlipidemia: Secondary | ICD-10-CM

## 2022-08-29 DIAGNOSIS — Z131 Encounter for screening for diabetes mellitus: Secondary | ICD-10-CM

## 2022-08-29 DIAGNOSIS — E538 Deficiency of other specified B group vitamins: Secondary | ICD-10-CM

## 2022-08-29 DIAGNOSIS — Z114 Encounter for screening for human immunodeficiency virus [HIV]: Secondary | ICD-10-CM

## 2022-08-30 ENCOUNTER — Other Ambulatory Visit (INDEPENDENT_AMBULATORY_CARE_PROVIDER_SITE_OTHER): Payer: BC Managed Care – PPO

## 2022-08-30 DIAGNOSIS — E782 Mixed hyperlipidemia: Secondary | ICD-10-CM

## 2022-08-30 DIAGNOSIS — Z1159 Encounter for screening for other viral diseases: Secondary | ICD-10-CM

## 2022-08-30 DIAGNOSIS — E890 Postprocedural hypothyroidism: Secondary | ICD-10-CM

## 2022-08-30 DIAGNOSIS — Z79899 Other long term (current) drug therapy: Secondary | ICD-10-CM | POA: Diagnosis not present

## 2022-08-30 DIAGNOSIS — Z131 Encounter for screening for diabetes mellitus: Secondary | ICD-10-CM | POA: Diagnosis not present

## 2022-08-30 DIAGNOSIS — E538 Deficiency of other specified B group vitamins: Secondary | ICD-10-CM | POA: Diagnosis not present

## 2022-08-30 DIAGNOSIS — Z114 Encounter for screening for human immunodeficiency virus [HIV]: Secondary | ICD-10-CM

## 2022-08-30 LAB — LIPID PANEL
Cholesterol: 270 mg/dL — ABNORMAL HIGH (ref 0–200)
HDL: 35.7 mg/dL — ABNORMAL LOW (ref >39.00)
LDL Cholesterol: 203 mg/dL — ABNORMAL HIGH (ref 0–99)
NonHDL: 234.21
Total CHOL/HDL Ratio: 8
Triglycerides: 155 mg/dL — ABNORMAL HIGH (ref 0.0–149.0)
VLDL: 31 mg/dL (ref 0.0–40.0)

## 2022-08-30 LAB — CBC WITH DIFFERENTIAL/PLATELET
Basophils Absolute: 0.1 10*3/uL (ref 0.0–0.1)
Basophils Relative: 1.3 % (ref 0.0–3.0)
Eosinophils Absolute: 0.1 10*3/uL (ref 0.0–0.7)
Eosinophils Relative: 0.7 % (ref 0.0–5.0)
HCT: 42.9 % (ref 36.0–46.0)
Hemoglobin: 15.1 g/dL — ABNORMAL HIGH (ref 12.0–15.0)
Lymphocytes Relative: 15.3 % (ref 12.0–46.0)
Lymphs Abs: 1.7 10*3/uL (ref 0.7–4.0)
MCHC: 35.2 g/dL (ref 30.0–36.0)
MCV: 91.8 fl (ref 78.0–100.0)
Monocytes Absolute: 0.7 10*3/uL (ref 0.1–1.0)
Monocytes Relative: 6.4 % (ref 3.0–12.0)
Neutro Abs: 8.6 10*3/uL — ABNORMAL HIGH (ref 1.4–7.7)
Neutrophils Relative %: 76.3 % (ref 43.0–77.0)
Platelets: 322 10*3/uL (ref 150.0–400.0)
RBC: 4.67 Mil/uL (ref 3.87–5.11)
RDW: 12.8 % (ref 11.5–15.5)
WBC: 11.3 10*3/uL — ABNORMAL HIGH (ref 4.0–10.5)

## 2022-08-30 LAB — BASIC METABOLIC PANEL
BUN: 13 mg/dL (ref 6–23)
CO2: 29 mEq/L (ref 19–32)
Calcium: 10 mg/dL (ref 8.4–10.5)
Chloride: 102 mEq/L (ref 96–112)
Creatinine, Ser: 0.81 mg/dL (ref 0.40–1.20)
GFR: 85.24 mL/min (ref >60.00)
Glucose, Bld: 94 mg/dL (ref 70–99)
Potassium: 4.7 mEq/L (ref 3.5–5.1)
Sodium: 139 mEq/L (ref 135–145)

## 2022-08-30 LAB — HEPATIC FUNCTION PANEL
ALT: 19 U/L (ref 0–35)
AST: 14 U/L (ref 0–37)
Albumin: 4.6 g/dL (ref 3.5–5.2)
Alkaline Phosphatase: 89 U/L (ref 39–117)
Bilirubin, Direct: 0.1 mg/dL (ref 0.0–0.3)
Total Bilirubin: 0.5 mg/dL (ref 0.2–1.2)
Total Protein: 7 g/dL (ref 6.0–8.3)

## 2022-08-30 LAB — T3, FREE: T3, Free: 3.2 pg/mL (ref 2.3–4.2)

## 2022-08-30 LAB — T4, FREE: Free T4: 0.76 ng/dL (ref 0.60–1.60)

## 2022-08-30 LAB — HEMOGLOBIN A1C: Hgb A1c MFr Bld: 5.5 % (ref 4.6–6.5)

## 2022-08-30 LAB — VITAMIN B12: Vitamin B-12: 238 pg/mL (ref 211–911)

## 2022-08-30 LAB — TSH: TSH: 0.62 u[IU]/mL (ref 0.35–5.50)

## 2022-08-31 LAB — HIV ANTIBODY (ROUTINE TESTING W REFLEX): HIV 1&2 Ab, 4th Generation: NONREACTIVE

## 2022-08-31 LAB — HEPATITIS C ANTIBODY: Hepatitis C Ab: NONREACTIVE

## 2022-09-06 ENCOUNTER — Ambulatory Visit (INDEPENDENT_AMBULATORY_CARE_PROVIDER_SITE_OTHER): Payer: BC Managed Care – PPO | Admitting: Family Medicine

## 2022-09-06 ENCOUNTER — Encounter: Payer: Self-pay | Admitting: Family Medicine

## 2022-09-06 VITALS — BP 116/76 | HR 79 | Temp 97.7°F | Ht 66.5 in | Wt 209.2 lb

## 2022-09-06 DIAGNOSIS — Z1211 Encounter for screening for malignant neoplasm of colon: Secondary | ICD-10-CM

## 2022-09-06 DIAGNOSIS — Z0000 Encounter for general adult medical examination without abnormal findings: Secondary | ICD-10-CM

## 2022-09-06 MED ORDER — ROSUVASTATIN CALCIUM 20 MG PO TABS: 20.0000 mg | ORAL_TABLET | Freq: Every day | ORAL | 3 refills | Status: DC

## 2022-09-06 MED ORDER — MUPIROCIN 2 % EX OINT: 1.0000 | TOPICAL_OINTMENT | Freq: Three times a day (TID) | CUTANEOUS | 1 refills | Status: DC

## 2022-09-06 NOTE — Progress Notes (Signed)
Angel Hernandez T. Angel Altier, MD, [place] at [facility] [place], [address]  Phone: [phone]  FAX: [place] - 52 y.o. female  MRN [id number]  Date of Birth: 1971-05-25  Date: 09/06/2022  PCP: Angel Loffler, MD  Referral: Angel Loffler, MD  Chief Complaint  Patient presents with   Annual Exam   Patient Care Team: Angel Loffler, MD as PCP - General (Family Medicine) Angel Evangelist, MD as Consulting Physician (Endocrinology) Subjective:   Angel Hernandez is a 52 y.o. pleasant patient who presents with the following:  Health Maintenance Summary Reviewed and updated, unless pt declines services.  Tobacco History Reviewed. smoker - 1 ppd or less.  Alcohol: No concerns, no excessive use Exercise Habits: Some activity, rec at least 30 mins 5 times a week - started to stretch some, and that is about it.  STD concerns: none Drug Use: None Lumps or breast concerns: no  Colon - cologuard Covid - no  Revision lipo surgery is upcoming.   The 10-year ASCVD risk score (Arnett DK, et al., 2019) is: 7.3%   Values used to calculate the score:     Age: 48 years     Sex: Female     Is Non-Hispanic African American: No     Diabetic: No     Tobacco smoker: Yes     Systolic Blood Pressure: 99991111 mmHg     Is BP treated: No     HDL Cholesterol: 35.7 mg/dL     Total Cholesterol: 270 mg/dL   Health Maintenance  Topic Date Due   COLONOSCOPY (Pts 45-60yr Insurance coverage will need to be confirmed)  Never done   COVID-19 Vaccine (1) 09/22/2022 (Originally 03/31/1976)   INFLUENZA VACCINE  10/29/2022 (Originally 02/28/2022)   PAP SMEAR-Modifier  01/20/2025   DTaP/Tdap/Td (2 - Td or Tdap) 09/21/2025   Hepatitis C Screening  Completed   HIV Screening  Completed   HPV VACCINES  Aged Out    Immunization History  Administered Date(s) Administered   Influenza Inj Mdck Quad Pf 05/14/2018   Tdap 09/22/2015    Patient Active Problem List   Diagnosis Date Noted   Other headache syndrome 08/25/2022   Recurrent major depression in full remission ([place] 02/13/2021   Chronic back pain greater than 3 months duration 02/13/2021   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   Post-operative hypothyroidism    Tobacco abuse    Thyroid cancer ([place]     Past Medical History:  Diagnosis Date   GERD (gastroesophageal reflux disease)    Headache    migraines   Hypothyroid    Thyroid cancer ([place] 2001   Tobacco abuse     Past Surgical History:  Procedure Laterality Date   BILATERAL SALPINGECTOMY Bilateral 03/19/2017   Procedure: BILATERAL SALPINGECTOMY;  Surgeon: Angel Hernandez, CHonor Loh MD;  Location: [facility];  Service: Gynecology;  Laterality: Bilateral;   CESAREAN SECTION     CYSTOSCOPY N/A 03/19/2017   Procedure: CYSTOSCOPY;  Surgeon: Angel Hernandez, CHonor Loh MD;  Location: [facility];  Service: Gynecology;  Laterality: N/A;   DILATION AND CURETTAGE OF UTERUS     KNEE ARTHROSCOPY Left    thyroidectomy  2001   VAGINAL HYSTERECTOMY N/A 03/19/2017   Procedure: HYSTERECTOMY VAGINAL;  Surgeon: Angel Hernandez, CHonor Loh MD;  Location: [facility];  Service: Gynecology;  Laterality: N/A;    Family History  Problem Relation Age of Onset   Alzheimer's disease  Father    Cancer Father    Breast cancer Neg Hx     Social History   Social History Narrative   4 children - 50, 13, 22, 61.   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  Review of Systems: Pertinent positives are listed above.  Otherwise, a full 14 point review of systems has been done in full and it is negative except where it is noted positive.  Objective:   BP 116/76   Pulse 79   Temp 97.7 F (36.5 C) (Temporal)   Ht 5' 6.5" (1.689 m)   Wt  209 lb 4 oz (94.9 kg)   LMP 03/09/2017   SpO2 98%   BMI 33.27 kg/m  Ideal Body Weight: Weight in (lb) to have BMI = 25: 156.9 No results found.    09/06/2022    3:22 PM 09/21/2021   12:22 PM 02/10/2021   12:08 PM 11/21/2016    3:30 PM  Depression screen PHQ 2/9  Decreased Interest 0  0 0  Down, Depressed, Hopeless 0 0 0 0  PHQ - 2 Score 0 0 0 0  Altered sleeping 0 0 0   Tired, decreased energy 0 1 1   Change in appetite 0 0 1   Feeling bad or failure about yourself  0 0 0   Trouble concentrating 0 0 1   Moving slowly or fidgety/restless 0 0 0   Suicidal thoughts 0 0 0   PHQ-9 Score 0 1 3   Difficult doing work/chores Not difficult at all Not difficult at all Not difficult at all      GEN: well developed, well nourished, no acute distress Eyes: conjunctiva and lids normal, PERRLA, EOMI ENT: TM clear, nares clear, oral exam WNL Neck: supple, no lymphadenopathy, no thyromegaly, no JVD Pulm: clear to auscultation and percussion, respiratory effort normal CV: regular rate and rhythm, S1-S2, no murmur, rub or gallop, no bruits Chest: no scars, masses, no lumps BREAST: breast exam declined GI: soft, non-tender; no hepatosplenomegaly, masses; active bowel sounds all quadrants GU: GU exam declined Lymph: no cervical, axillary or inguinal adenopathy MSK: gait normal, muscle tone and strength WNL, no joint swelling, effusions, discoloration, crepitus  SKIN: clear, good turgor, color WNL, no rashes, lesions, or ulcerations Neuro: normal mental status, normal strength, sensation, and motion Psych: alert; oriented to person, place and time, normally interactive and not anxious or depressed in appearance.   All labs reviewed with patient. Results for orders placed or performed in visit on 08/30/22  TSH  Result Value Ref Range   TSH 0.62 0.35 - 5.50 uIU/mL  T4, free  Result Value Ref Range   Free T4 0.76 0.60 - 1.60 ng/dL  T3, free  Result Value Ref Range   T3, Free 3.2 2.3 - 4.2  pg/mL  HIV Antibody (routine testing w rflx)  Result Value Ref Range   HIV 1&2 Ab, 4th Generation NON-REACTIVE NON-REACTIVE  Hepatitis C antibody  Result Value Ref Range   Hepatitis C Ab NON-REACTIVE NON-REACTIVE  Vitamin B12  Result Value Ref Range   Vitamin B-12 238 211 - 911 pg/mL  Lipid panel  Result Value Ref Range   Cholesterol 270 (H) 0 - 200 mg/dL   Triglycerides 155.0 (H) 0.0 - 149.0 mg/dL   HDL  35.70 (L) >39.00 mg/dL   VLDL 31.0 0.0 - 40.0 mg/dL   LDL Cholesterol 203 (H) 0 - 99 mg/dL   Total CHOL/HDL Ratio 8    NonHDL 234.21   Hemoglobin A1c  Result Value Ref Range   Hgb A1c MFr Bld 5.5 4.6 - 6.5 %  Hepatic function panel  Result Value Ref Range   Total Bilirubin 0.5 0.2 - 1.2 mg/dL   Bilirubin, Direct 0.1 0.0 - 0.3 mg/dL   Alkaline Phosphatase 89 39 - 117 U/L   AST 14 0 - 37 U/L   ALT 19 0 - 35 U/L   Total Protein 7.0 6.0 - 8.3 g/dL   Albumin 4.6 3.5 - 5.2 g/dL  CBC with Differential/Platelet  Result Value Ref Range   WBC 11.3 (H) 4.0 - 10.5 K/uL   RBC 4.67 3.87 - 5.11 Mil/uL   Hemoglobin 15.1 (H) 12.0 - 15.0 g/dL   HCT 42.9 36.0 - 46.0 %   MCV 91.8 78.0 - 100.0 fl   MCHC 35.2 30.0 - 36.0 g/dL   RDW 12.8 11.5 - 15.5 %   Platelets 322.0 150.0 - 400.0 K/uL   Neutrophils Relative % 76.3 43.0 - 77.0 %   Lymphocytes Relative 15.3 12.0 - 46.0 %   Monocytes Relative 6.4 3.0 - 12.0 %   Eosinophils Relative 0.7 0.0 - 5.0 %   Basophils Relative 1.3 0.0 - 3.0 %   Neutro Abs 8.6 (H) 1.4 - 7.7 K/uL   Lymphs Abs 1.7 0.7 - 4.0 K/uL   Monocytes Absolute 0.7 0.1 - 1.0 K/uL   Eosinophils Absolute 0.1 0.0 - 0.7 K/uL   Basophils Absolute 0.1 0.0 - 0.1 K/uL  Basic metabolic panel  Result Value Ref Range   Sodium 139 135 - 145 mEq/L   Potassium 4.7 3.5 - 5.1 mEq/L   Chloride 102 96 - 112 mEq/L   CO2 29 19 - 32 mEq/L   Glucose, Bld 94 70 - 99 mg/dL   BUN 13 6 - 23 mg/dL   Creatinine, Ser 0.81 0.40 - 1.20 mg/dL   GFR 85.24 >60.00 mL/min   Calcium 10.0 8.4 - 10.5 mg/dL    No results found.  Assessment and Plan:     ICD-10-CM   1. Healthcare maintenance  Z00.00     2. Colon cancer screening  Z12.11 Cologuard     Cholesterol is high with an ASCVD of 7%.  For now, she is going to work on her diet, exercise patterns, and try to drop this nonpharmacologically.  She is open to getting Cologuard.  She is not going to get a COVID-19 vaccine.  Also counseled her on the importance of quitting smoking.  Health Maintenance Exam: The patient's preventative maintenance and recommended screening tests for an annual wellness exam were reviewed in full today. Brought up to date unless services declined.  Counselled on the importance of diet, exercise, and its role in overall health and mortality. The patient's FH and SH was reviewed, including their home life, tobacco status, and drug and alcohol status.  Follow-up in 1 year for physical exam or additional follow-up below.  Disposition: No follow-ups on file.  No future appointments.  Meds ordered this encounter  Medications   mupirocin ointment (BACTROBAN) 2 %    Sig: Apply 1 Application topically 3 (three) times daily.    Dispense:  30 g    Refill:  1   rosuvastatin (CRESTOR) 20 MG tablet    Sig: Take 1 tablet (20  mg total) by mouth daily.    Dispense:  90 tablet    Refill:  3   Medications Discontinued During This Encounter  Medication Reason   Vitamin D, Ergocalciferol, (DRISDOL) 1.25 MG (50000 UNIT) CAPS capsule Completed Course   mupirocin ointment (BACTROBAN) 2 % Reorder   Orders Placed This Encounter  Procedures   Cologuard    Signed,  Ercell Razon T. Skyler Carel, MD   Allergies as of 09/06/2022   No Known Allergies      Medication List        Accurate as of September 06, 2022 11:59 PM. If you have any questions, ask your nurse or doctor.          STOP taking these medications    Vitamin D (Ergocalciferol) 1.25 MG (50000 UNIT) Caps capsule Commonly known as: DRISDOL Stopped by:  Angel Loffler, MD       TAKE these medications    escitalopram 10 MG tablet Commonly known as: LEXAPRO TAKE 1 TABLET(10 MG) BY MOUTH DAILY   fluticasone 50 MCG/ACT nasal spray Commonly known as: FLONASE Place 2 sprays into both nostrils daily.   levothyroxine 112 MCG tablet Commonly known as: SYNTHROID TAKE 1 TABLET BY MOUTH DAILY   meclizine 12.5 MG tablet Commonly known as: ANTIVERT Take 1 tablet (12.5 mg total) by mouth 3 (three) times daily as needed for dizziness.   mupirocin ointment 2 % Commonly known as: BACTROBAN Apply 1 Application topically 3 (three) times daily.   rosuvastatin 20 MG tablet Commonly known as: Crestor Take 1 tablet (20 mg total) by mouth daily. Started by: Angel Loffler, MD   valACYclovir 1000 MG tablet Commonly known as: VALTREX Take 1 tablet (1,000 mg total) by mouth 3 (three) times daily.

## 2022-09-24 ENCOUNTER — Other Ambulatory Visit: Payer: Self-pay | Admitting: Nurse Practitioner

## 2022-09-24 DIAGNOSIS — R42 Dizziness and giddiness: Secondary | ICD-10-CM

## 2022-09-30 LAB — COLOGUARD: COLOGUARD: NEGATIVE

## 2022-10-02 ENCOUNTER — Encounter: Payer: Self-pay | Admitting: *Deleted

## 2022-11-28 ENCOUNTER — Other Ambulatory Visit: Payer: Self-pay | Admitting: Family Medicine

## 2023-02-19 ENCOUNTER — Other Ambulatory Visit: Payer: Self-pay | Admitting: Family Medicine

## 2023-02-19 MED ORDER — VALACYCLOVIR HCL 1 G PO TABS: 2000.0000 mg | ORAL_TABLET | Freq: Two times a day (BID) | ORAL | 0 refills | Status: AC

## 2023-02-19 MED ORDER — ESCITALOPRAM OXALATE 10 MG PO TABS: 10.0000 mg | ORAL_TABLET | Freq: Every day | ORAL | 3 refills | Status: DC

## 2023-02-19 NOTE — Telephone Encounter (Signed)
Spoke with patient when she returned call, patient says she is needing this refilled because she has a fever blister. Patient also asked about her medication lexapro. She states that another provider does her annual physical but Dr. Patsy Lager prescribes her this medication. Patient wants to know when Dr. Patsy Lager would like to see her again so she may have enough refills for this medication.

## 2023-02-19 NOTE — Telephone Encounter (Signed)
Tried to call Angel Hernandez.  Her voicemail is full so I was unable to leave a message.  If patient calls back, we need to find out what she requesting the Valacyclovir for?

## 2023-02-19 NOTE — Telephone Encounter (Signed)
Last office visit 09/06/22 for CPE.  Last refilled 09/02/2019 for #21 with no refills.  Next Appt: No future appointments.

## 2023-02-19 NOTE — Telephone Encounter (Signed)
Can you check?  This is dosing for shingles.  Does she think that she has shingles again?

## 2023-02-19 NOTE — Addendum Note (Signed)
Addended by: Damita Lack on: 02/19/2023 12:04 PM   Modules accepted: Orders

## 2023-02-19 NOTE — Telephone Encounter (Signed)
OK to refill Lexapro for 1 year

## 2023-08-28 ENCOUNTER — Other Ambulatory Visit: Payer: Self-pay | Admitting: Family Medicine

## 2023-08-28 NOTE — Telephone Encounter (Signed)
Vm box was full, sent mychart message

## 2023-08-28 NOTE — Telephone Encounter (Signed)
Please schedule CPE with fasting labs prior with Dr. Patsy Lager.

## 2023-08-29 ENCOUNTER — Other Ambulatory Visit: Payer: Self-pay | Admitting: Family Medicine

## 2023-09-30 ENCOUNTER — Encounter: Payer: Self-pay | Admitting: Family Medicine

## 2023-09-30 NOTE — Progress Notes (Deleted)
 Morena Mckissack T. Pristine Gladhill, MD, CAQ Sports Medicine [facility] at [facility] [address] [place], [address]  Phone: [phone]  FAX: [phone]  Angel Hernandez - 53 y.o. female  MRN [id number]  Date of Birth: June 23, 1971  Date: 10/01/2023  PCP: Hannah Beat, MD  Referral: Hannah Beat, MD  No chief complaint on file.  Patient Care Team: Hannah Beat, MD as PCP - General (Family Medicine) Yetta Flock, MD as Consulting Physician (Endocrinology) Subjective:   Angel Hernandez is a 53 y.o. pleasant patient who presents with the following:  Health Maintenance Summary Reviewed and updated, unless pt declines services.  Tobacco History Reviewed. Non-smoker Alcohol: No concerns, no excessive use Exercise Habits: Some activity, rec at least 30 mins 5 times a week STD concerns: none Drug Use: None Lumps or breast concerns: no  Prevnar-20 Covid Flu Cervical ca scr - 12/2021 [facility] Colon (Gyn referred) - cologuard in 2024? Mammo  Health Maintenance  Topic Date Due   COVID-19 Vaccine (1) Never done   Pneumococcal Vaccine 47-52 Years old (1 of 2 - PCV) Never done   Zoster Vaccines- Shingrix (1 of 2) Never done   Cervical Cancer Screening (HPV/Pap Cotest)  10/24/2015   Colonoscopy  Never done   INFLUENZA VACCINE  03/01/2023   MAMMOGRAM  04/01/2023   DTaP/Tdap/Td (2 - Td or Tdap) 09/21/2025   Hepatitis C Screening  Completed   HIV Screening  Completed   HPV VACCINES  Aged Out    Immunization History  Administered Date(s) Administered   Influenza Inj Mdck Quad Pf 05/14/2018   Tdap 09/22/2015   Patient Active Problem List   Diagnosis Date Noted   Other headache syndrome 08/25/2022   Recurrent major depression in full remission (HCC) 02/13/2021   Chronic back pain greater than 3 months duration 02/13/2021   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   Post-operative hypothyroidism    Tobacco abuse    Thyroid cancer (HCC)      Past Medical History:  Diagnosis Date   GERD (gastroesophageal reflux disease)    Hypothyroid    Migraine headache    migraines   Thyroid cancer (HCC) 2001   Tobacco abuse     Past Surgical History:  Procedure Laterality Date   BILATERAL SALPINGECTOMY Bilateral 03/19/2017   Procedure: BILATERAL SALPINGECTOMY;  Surgeon: Ward, Elenora Fender, MD;  Location: [facility];  Service: Gynecology;  Laterality: Bilateral;   CESAREAN SECTION     CYSTOSCOPY N/A 03/19/2017   Procedure: CYSTOSCOPY;  Surgeon: Ward, Elenora Fender, MD;  Location: [facility];  Service: Gynecology;  Laterality: N/A;   DILATION AND CURETTAGE OF UTERUS     KNEE ARTHROSCOPY Left    thyroidectomy  2001   VAGINAL HYSTERECTOMY N/A 03/19/2017   Procedure: HYSTERECTOMY VAGINAL;  Surgeon: Ward, Elenora Fender, MD;  Location: [facility];  Service: Gynecology;  Laterality: N/A;    Family History  Problem Relation Age of Onset   Alzheimer's disease Father    Cancer Father    Breast cancer Neg Hx     Social History   Social History Narrative   4 children - 20, 19, 107, 52.   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  Review of Systems: Pertinent positives are listed above.  Otherwise, a full  14 point review of systems has been done in full and it is negative except where it is noted positive.  Objective:   LMP 03/09/2017  Ideal Body Weight:   No results found.    09/06/2022    3:22 PM 09/21/2021   12:22 PM 02/10/2021   12:08 PM 11/21/2016    3:30 PM  Depression screen PHQ 2/9  Decreased Interest 0  0 0  Down, Depressed, Hopeless 0 0 0 0  PHQ - 2 Score 0 0 0 0  Altered sleeping 0 0 0   Tired, decreased energy 0 1 1   Change in appetite 0 0 1   Feeling bad or failure about yourself  0 0 0   Trouble concentrating 0 0 1   Moving  slowly or fidgety/restless 0 0 0   Suicidal thoughts 0 0 0   PHQ-9 Score 0 1 3   Difficult doing work/chores Not difficult at all Not difficult at all Not difficult at all      GEN: well developed, well nourished, no acute distress Eyes: conjunctiva and lids normal, PERRLA, EOMI ENT: TM clear, nares clear, oral exam WNL Neck: supple, no lymphadenopathy, no thyromegaly, no JVD Pulm: clear to auscultation and percussion, respiratory effort normal CV: regular rate and rhythm, S1-S2, no murmur, rub or gallop, no bruits Chest: no scars, masses, no lumps BREAST: breast exam declined GI: soft, non-tender; no hepatosplenomegaly, masses; active bowel sounds all quadrants GU: GU exam declined Lymph: no cervical, axillary or inguinal adenopathy MSK: gait normal, muscle tone and strength WNL, no joint swelling, effusions, discoloration, crepitus  SKIN: clear, good turgor, color WNL, no rashes, lesions, or ulcerations Neuro: normal mental status, normal strength, sensation, and motion Psych: alert; oriented to person, place and time, normally interactive and not anxious or depressed in appearance.   All labs reviewed with patient. Results for orders placed or performed in visit on 09/06/22  Cologuard   Collection Time: 09/19/22  3:50 AM  Result Value Ref Range   COLOGUARD Negative Negative   No results found.  Assessment and Plan:     ICD-10-CM   1. Healthcare maintenance  Z00.00       Health Maintenance Exam: The patient's preventative maintenance and recommended screening tests for an annual wellness exam were reviewed in full today. Brought up to date unless services declined.  Counselled on the importance of diet, exercise, and its role in overall health and mortality. The patient's FH and SH was reviewed, including their home life, tobacco status, and drug and alcohol status.  Follow-up in 1 year for physical exam or additional follow-up below.  Disposition: No follow-ups on  file.  Future Appointments  Date Time Provider Department Center  10/01/2023 10:20 AM Amaryllis Malmquist, Karleen [place], MD [facility]    No orders of the defined types were placed in this encounter.  There are no discontinued medications. No orders of the defined types were placed in this encounter.   Signed,  Elpidio Galea. Bless Belshe, MD   Allergies as of 10/01/2023   No Known Allergies      Medication List        Accurate as of September 30, 2023 11:43 AM. If you have any questions, ask your nurse or doctor.          escitalopram 10 MG tablet Commonly known as: LEXAPRO Take 1 tablet (10 mg total) by mouth daily.   fluticasone 50 MCG/ACT nasal spray Commonly known as: FLONASE Place 2 sprays into both  nostrils daily.   levothyroxine 112 MCG tablet Commonly known as: SYNTHROID TAKE 1 TABLET BY MOUTH DAILY   meclizine 12.5 MG tablet Commonly known as: ANTIVERT Take 1 tablet (12.5 mg total) by mouth 3 (three) times daily as needed for dizziness.   mupirocin ointment 2 % Commonly known as: BACTROBAN Apply 1 Application topically 3 (three) times daily.   rosuvastatin 20 MG tablet Commonly known as: CRESTOR TAKE 1 TABLET(20 MG) BY MOUTH DAILY   valACYclovir 1000 MG tablet Commonly known as: VALTREX Take 2 tablets (2,000 mg total) by mouth 2 (two) times daily. For 24 hours only for fever blisters

## 2023-10-01 ENCOUNTER — Encounter: Payer: BC Managed Care – PPO | Admitting: Family Medicine

## 2023-10-01 DIAGNOSIS — Z124 Encounter for screening for malignant neoplasm of cervix: Secondary | ICD-10-CM

## 2023-10-01 DIAGNOSIS — Z0000 Encounter for general adult medical examination without abnormal findings: Secondary | ICD-10-CM

## 2023-10-05 ENCOUNTER — Other Ambulatory Visit: Payer: Self-pay | Admitting: Family Medicine

## 2023-10-05 MED ORDER — ESCITALOPRAM OXALATE 10 MG PO TABS: 10.0000 mg | ORAL_TABLET | Freq: Every day | ORAL | 0 refills | Status: DC

## 2023-10-05 NOTE — Telephone Encounter (Signed)
 Refill sent as requested.

## 2023-10-05 NOTE — Telephone Encounter (Signed)
 Copied from CRM [id number]. Topic: Clinical - Medication Refill >> Oct 05, 2023  8:33 AM Florestine Avers wrote: Most Recent Primary Care Visit:  Provider: Hannah Beat  Department: [facility]  Visit Type: PHYSICAL  Date: 09/06/2022  Medication: escitalopram (LEXAPRO) 10 MG tablet  Has the patient contacted their pharmacy? Yes (Agent: If no, request that the patient contact the pharmacy for the refill. If patient does not wish to contact the pharmacy document the reason why and proceed with request.) (Agent: If yes, when and what did the pharmacy advise?)  Is this the correct pharmacy for this prescription? Yes If no, delete pharmacy and type the correct one.  This is the patient's preferred pharmacy:   [id number] - [place], [place] - [address] AT [facility] [address] [place] [address] Phone: [phone] Fax: [phone]   Has the prescription been filled recently? No  Is the patient out of the medication? Yes  Has the patient been seen for an appointment in the last year OR does the patient have an upcoming appointment? Yes  Can we respond through MyChart? Yes  Agent: Please be advised that Rx refills may take up to 3 business days. We ask that you follow-up with your pharmacy.

## 2023-10-09 ENCOUNTER — Encounter: Payer: Self-pay | Admitting: Family Medicine

## 2023-10-09 NOTE — Progress Notes (Unsigned)
 Angel Viverette T. Angel Lundy, MD, CAQ Sports Medicine [facility] at [facility] [address] [place], [address]  Phone: [phone]  FAX: [phone]  OLAYINKA GATHERS - 53 y.o. female  MRN [id number]  Date of Birth: 1970-12-09  Date: 10/10/2023  PCP: Hannah Beat, MD  Referral: Hannah Beat, MD  No chief complaint on file.  Patient Care Team: Hannah Beat, MD as PCP - General (Family Medicine) Yetta Flock, MD as Consulting Physician (Endocrinology) Subjective:   Angel Hernandez is a 53 y.o. pleasant patient who presents with the following:  Health Maintenance Summary Reviewed and updated, unless pt declines services.  Tobacco History Reviewed.  Alcohol: No concerns, no excessive use Exercise Habits: Some activity, rec at least 30 mins 5 times a week STD concerns: none Drug Use: None Lumps or breast concerns: no  She does have a history of hypothyroidism after surgical removal of her thyroid due to thyroid carcinoma.  She continues to be a smoker.  COVID-19 Shingrix Flu Mammogram  Health Maintenance  Topic Date Due   COVID-19 Vaccine (1) Never done   Pneumococcal Vaccine 33-37 Years old (1 of 2 - PCV) Never done   Zoster Vaccines- Shingrix (1 of 2) Never done   INFLUENZA VACCINE  03/01/2023   MAMMOGRAM  04/01/2023   Fecal DNA (Cologuard)  09/19/2025   DTaP/Tdap/Td (2 - Td or Tdap) 09/21/2025   Hepatitis C Screening  Completed   HIV Screening  Completed   HPV VACCINES  Aged Out    Immunization History  Administered Date(s) Administered   Influenza Inj Mdck Quad Pf 05/14/2018   Tdap 09/22/2015   Patient Active Problem List   Diagnosis Date Noted   Other headache syndrome 08/25/2022   Recurrent major depression in full remission (HCC) 02/13/2021   Chronic back pain greater than 3 months duration 02/13/2021   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   Post-operative hypothyroidism    Tobacco abuse     Thyroid cancer (HCC)     Past Medical History:  Diagnosis Date   GERD (gastroesophageal reflux disease)    Hypothyroid    Migraine headache    Thyroid cancer (HCC) 2001   Tobacco abuse     Past Surgical History:  Procedure Laterality Date   BILATERAL SALPINGECTOMY Bilateral 03/19/2017   Procedure: BILATERAL SALPINGECTOMY;  Surgeon: Ward, Elenora Fender, MD;  Location: [facility];  Service: Gynecology;  Laterality: Bilateral;   CESAREAN SECTION     CYSTOSCOPY N/A 03/19/2017   Procedure: CYSTOSCOPY;  Surgeon: Ward, Elenora Fender, MD;  Location: [facility];  Service: Gynecology;  Laterality: N/A;   DILATION AND CURETTAGE OF UTERUS     KNEE ARTHROSCOPY Left    thyroidectomy  2001   VAGINAL HYSTERECTOMY N/A 03/19/2017   Procedure: HYSTERECTOMY VAGINAL;  Surgeon: Ward, Elenora Fender, MD;  Location: [facility];  Service: Gynecology;  Laterality: N/A;    Family History  Problem Relation Age of Onset   Alzheimer's disease Father    Cancer Father    Breast cancer Neg Hx     Social History   Social History Narrative   4 children - 20, 63, 60, 87.   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  Review of Systems: Pertinent positives are listed above.  Otherwise, a  full 14 point review of systems has been done in full and it is negative except where it is noted positive.  Objective:   LMP 03/09/2017  Ideal Body Weight:   No results found.    09/06/2022    3:22 PM 09/21/2021   12:22 PM 02/10/2021   12:08 PM 11/21/2016    3:30 PM  Depression screen PHQ 2/9  Decreased Interest 0  0 0  Down, Depressed, Hopeless 0 0 0 0  PHQ - 2 Score 0 0 0 0  Altered sleeping 0 0 0   Tired, decreased energy 0 1 1   Change in appetite 0 0 1   Feeling bad or failure about yourself  0 0 0   Trouble concentrating 0 0 1    Moving slowly or fidgety/restless 0 0 0   Suicidal thoughts 0 0 0   PHQ-9 Score 0 1 3   Difficult doing work/chores Not difficult at all Not difficult at all Not difficult at all      GEN: well developed, well nourished, no acute distress Eyes: conjunctiva and lids normal, PERRLA, EOMI ENT: TM clear, nares clear, oral exam WNL Neck: supple, no lymphadenopathy, no thyromegaly, no JVD Pulm: clear to auscultation and percussion, respiratory effort normal CV: regular rate and rhythm, S1-S2, no murmur, rub or gallop, no bruits Chest: no scars, masses, no lumps BREAST: breast exam declined GI: soft, non-tender; no hepatosplenomegaly, masses; active bowel sounds all quadrants GU: GU exam declined Lymph: no cervical, axillary or inguinal adenopathy MSK: gait normal, muscle tone and strength WNL, no joint swelling, effusions, discoloration, crepitus  SKIN: clear, good turgor, color WNL, no rashes, lesions, or ulcerations Neuro: normal mental status, normal strength, sensation, and motion Psych: alert; oriented to person, place and time, normally interactive and not anxious or depressed in appearance.   All labs reviewed with patient. Results for orders placed or performed in visit on 09/06/22  Cologuard   Collection Time: 09/19/22  3:50 AM  Result Value Ref Range   COLOGUARD Negative Negative   No results found.  Assessment and Plan:     ICD-10-CM   1. Healthcare maintenance  Z00.00       Health Maintenance Exam: The patient's preventative maintenance and recommended screening tests for an annual wellness exam were reviewed in full today. Brought up to date unless services declined.  Counselled on the importance of diet, exercise, and its role in overall health and mortality. The patient's FH and SH was reviewed, including their home life, tobacco status, and drug and alcohol status.  Follow-up in 1 year for physical exam or additional follow-up below.  Disposition: No  follow-ups on file.  Future Appointments  Date Time Provider Department Center  10/10/2023  8:40 AM Jachelle Fluty, Karleen Hampshire, MD [facility]    No orders of the defined types were placed in this encounter.  There are no discontinued medications. No orders of the defined types were placed in this encounter.   Signed,  Elpidio Galea. Tristen Luce, MD   Allergies as of 10/10/2023   No Known Allergies      Medication List        Accurate as of October 09, 2023  2:13 PM. If you have any questions, ask your nurse or doctor.          escitalopram 10 MG tablet Commonly known as: LEXAPRO Take 1 tablet (10 mg total) by mouth daily.   fluticasone 50 MCG/ACT nasal spray Commonly known as: FLONASE Place 2  sprays into both nostrils daily.   levothyroxine 112 MCG tablet Commonly known as: SYNTHROID TAKE 1 TABLET BY MOUTH DAILY   meclizine 12.5 MG tablet Commonly known as: ANTIVERT Take 1 tablet (12.5 mg total) by mouth 3 (three) times daily as needed for dizziness.   mupirocin ointment 2 % Commonly known as: BACTROBAN Apply 1 Application topically 3 (three) times daily.   rosuvastatin 20 MG tablet Commonly known as: CRESTOR TAKE 1 TABLET(20 MG) BY MOUTH DAILY   valACYclovir 1000 MG tablet Commonly known as: VALTREX Take 2 tablets (2,000 mg total) by mouth 2 (two) times daily. For 24 hours only for fever blisters

## 2023-10-10 ENCOUNTER — Encounter: Payer: Self-pay | Admitting: Family Medicine

## 2023-10-10 ENCOUNTER — Ambulatory Visit (INDEPENDENT_AMBULATORY_CARE_PROVIDER_SITE_OTHER): Admitting: Family Medicine

## 2023-10-10 VITALS — BP 120/80 | HR 72 | Temp 98.1°F | Ht 66.5 in | Wt 219.1 lb

## 2023-10-10 DIAGNOSIS — Z131 Encounter for screening for diabetes mellitus: Secondary | ICD-10-CM | POA: Diagnosis not present

## 2023-10-10 DIAGNOSIS — Z0000 Encounter for general adult medical examination without abnormal findings: Secondary | ICD-10-CM | POA: Diagnosis not present

## 2023-10-10 DIAGNOSIS — Z79899 Other long term (current) drug therapy: Secondary | ICD-10-CM

## 2023-10-10 DIAGNOSIS — E782 Mixed hyperlipidemia: Secondary | ICD-10-CM

## 2023-10-10 LAB — LIPID PANEL
Cholesterol: 152 mg/dL (ref 0–200)
HDL: 38 mg/dL — ABNORMAL LOW (ref >39.00)
LDL Cholesterol: 86 mg/dL (ref 0–99)
NonHDL: 114.37
Total CHOL/HDL Ratio: 4
Triglycerides: 140 mg/dL (ref 0.0–149.0)
VLDL: 28 mg/dL (ref 0.0–40.0)

## 2023-10-10 LAB — CBC WITH DIFFERENTIAL/PLATELET
Basophils Absolute: 0.1 10*3/uL (ref 0.0–0.1)
Basophils Relative: 1.4 % (ref 0.0–3.0)
Eosinophils Absolute: 0.1 10*3/uL (ref 0.0–0.7)
Eosinophils Relative: 2.2 % (ref 0.0–5.0)
HCT: 42.5 % (ref 36.0–46.0)
Hemoglobin: 14.6 g/dL (ref 12.0–15.0)
Lymphocytes Relative: 34.6 % (ref 12.0–46.0)
Lymphs Abs: 2.3 10*3/uL (ref 0.7–4.0)
MCHC: 34.3 g/dL (ref 30.0–36.0)
MCV: 94 fl (ref 78.0–100.0)
Monocytes Absolute: 0.5 10*3/uL (ref 0.1–1.0)
Monocytes Relative: 7.3 % (ref 3.0–12.0)
Neutro Abs: 3.6 10*3/uL (ref 1.4–7.7)
Neutrophils Relative %: 54.5 % (ref 43.0–77.0)
Platelets: 295 10*3/uL (ref 150.0–400.0)
RBC: 4.52 Mil/uL (ref 3.87–5.11)
RDW: 12.5 % (ref 11.5–15.5)
WBC: 6.7 10*3/uL (ref 4.0–10.5)

## 2023-10-10 LAB — BASIC METABOLIC PANEL
BUN: 11 mg/dL (ref 6–23)
CO2: 29 meq/L (ref 19–32)
Calcium: 9.9 mg/dL (ref 8.4–10.5)
Chloride: 103 meq/L (ref 96–112)
Creatinine, Ser: 0.76 mg/dL (ref 0.40–1.20)
GFR: 91.3 mL/min (ref >60.00)
Glucose, Bld: 99 mg/dL (ref 70–99)
Potassium: 4.8 meq/L (ref 3.5–5.1)
Sodium: 138 meq/L (ref 135–145)

## 2023-10-10 LAB — HEPATIC FUNCTION PANEL
ALT: 24 U/L (ref 0–35)
AST: 21 U/L (ref 0–37)
Albumin: 4.7 g/dL (ref 3.5–5.2)
Alkaline Phosphatase: 76 U/L (ref 39–117)
Bilirubin, Direct: 0.1 mg/dL (ref 0.0–0.3)
Total Bilirubin: 0.4 mg/dL (ref 0.2–1.2)
Total Protein: 7 g/dL (ref 6.0–8.3)

## 2023-10-10 LAB — HEMOGLOBIN A1C: Hgb A1c MFr Bld: 5.8 % (ref 4.6–6.5)

## 2023-10-10 MED ORDER — ESCITALOPRAM OXALATE 10 MG PO TABS: 15.0000 mg | ORAL_TABLET | Freq: Every day | ORAL | 3 refills | Status: AC

## 2023-10-10 NOTE — Patient Instructions (Signed)
  You do not need a referral to make a mammogram appointment, and you may call to make her own mammogram appointment directly around your schedule.  MAMMOGRAPHY IN [place]:  [facility] [phone] [address] [place], [place] [address]  [facility] (Formerly [facility]) [address] [place], [place] [address] Phone: [phone] Toll Free: [phone]  MAMMOGRAPHY IN [place]:  [facility] ([place] or [place]) [phone] Located on the campus of [facility] ([place])  [facility] ([place] [facility]) [address]  [place], [place] [address]  

## 2023-10-12 ENCOUNTER — Encounter: Payer: Self-pay | Admitting: Family Medicine

## 2023-10-19 ENCOUNTER — Encounter: Payer: Self-pay | Admitting: *Deleted

## 2024-02-03 ENCOUNTER — Other Ambulatory Visit: Payer: Self-pay | Admitting: Family Medicine

## 2024-02-06 ENCOUNTER — Other Ambulatory Visit: Payer: Self-pay | Admitting: Family Medicine

## 2024-02-29 ENCOUNTER — Telehealth: Payer: Self-pay

## 2024-02-29 NOTE — Telephone Encounter (Signed)
 The patient dropped off form to be completed by provider. Placed in providers box at front desk.

## 2024-02-29 NOTE — Telephone Encounter (Signed)
 Annual Physical Confirmation Form placed in Dr. Eleanore office in box for signature.

## 2024-03-03 NOTE — Telephone Encounter (Signed)
 Angel Hernandez notified by telephone that her paperwork is ready to be picked up at the front desk.

## 2024-03-14 ENCOUNTER — Telehealth: Payer: Self-pay | Admitting: Family Medicine

## 2024-03-14 NOTE — Telephone Encounter (Signed)
 Pt came in and picked up form.

## 2024-05-26 ENCOUNTER — Ambulatory Visit
Admission: EM | Admit: 2024-05-26 | Discharge: 2024-05-26 | Disposition: A | Discharge status: Home / Self Care | Attending: Emergency Medicine | Admitting: Emergency Medicine

## 2024-05-26 ENCOUNTER — Ambulatory Visit: Payer: Self-pay

## 2024-05-26 DIAGNOSIS — J0140 Acute pansinusitis, unspecified: Secondary | ICD-10-CM | POA: Diagnosis not present

## 2024-05-26 MED ORDER — PROMETHAZINE-DM 6.25-15 MG/5ML PO SYRP: 5.0000 mL | ORAL_SOLUTION | Freq: Four times a day (QID) | ORAL | 0 refills | Status: DC | PRN

## 2024-05-26 MED ORDER — AMOXICILLIN-POT CLAVULANATE 875-125 MG PO TABS: 1.0000 | ORAL_TABLET | Freq: Two times a day (BID) | ORAL | 0 refills | Status: AC

## 2024-05-26 MED ORDER — MECLIZINE HCL 12.5 MG PO TABS: 12.5000 mg | ORAL_TABLET | Freq: Three times a day (TID) | ORAL | 0 refills | Status: AC | PRN

## 2024-05-26 MED ORDER — IPRATROPIUM BROMIDE 0.06 % NA SOLN: 2.0000 | Freq: Four times a day (QID) | NASAL | 12 refills | Status: AC

## 2024-05-26 MED ORDER — BENZONATATE 100 MG PO CAPS: 200.0000 mg | ORAL_CAPSULE | Freq: Three times a day (TID) | ORAL | 0 refills | Status: DC

## 2024-05-26 NOTE — ED Triage Notes (Signed)
 Pt c/o sinus pressure,cough,drainage x1 mon & R sided neck swelling x1 day. Has tried OTC meds w/o relief.

## 2024-05-26 NOTE — ED Provider Notes (Signed)
 [facility]    CSN: [id number] Arrival date & time: 05/26/24  1355      History   Chief Complaint Chief Complaint  Patient presents with   Sinus Problem   Cough    HPI Angel Hernandez is a 53 y.o. female.   HPI  53 year old female with past medical history significant for thyroid  cancer, migraine headaches, hypothyroidism, and GERD presents for evaluation of 1 month worth of sinus pressure and bloody green discharge with an infrequent nonproductive cough.  She started develop pain and swelling to the right side of her neck yesterday.  No fever.  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   Post-operative hypothyroidism    Tobacco abuse    Thyroid  cancer [facility])     Past Surgical History:  Procedure Laterality Date   BILATERAL SALPINGECTOMY Bilateral 03/19/2017   Procedure: BILATERAL SALPINGECTOMY;  Surgeon: Ward, Mitzie BROCKS, MD;  Location: [facility];  Service: Gynecology;  Laterality: Bilateral;   CESAREAN SECTION     CYSTOSCOPY N/A 03/19/2017   Procedure: CYSTOSCOPY;  Surgeon: Ward, Mitzie BROCKS, MD;  Location: [facility];  Service: Gynecology;  Laterality: N/A;   DILATION AND CURETTAGE OF UTERUS     KNEE ARTHROSCOPY Left    thyroidectomy  2001   VAGINAL HYSTERECTOMY N/A 03/19/2017   Procedure: HYSTERECTOMY VAGINAL;  Surgeon: Ward, Mitzie BROCKS, MD;  Location: [facility];  Service: Gynecology;  Laterality: N/A;    OB History     Gravida  4   Para  4   Term  4   Preterm      AB      Living  4      SAB      IAB      Ectopic      Multiple      Live Births               Home Medications     Prior to Admission medications   Medication Sig Start Date End Date Taking? Authorizing Provider  amoxicillin -clavulanate (AUGMENTIN ) 875-125 MG tablet Take 1 tablet by mouth every 12 (twelve) hours for 10 days. 05/26/24 06/05/24 Yes Bernardino Ditch, NP  benzonatate  (TESSALON ) 100 MG capsule Take 2 capsules (200 mg total) by mouth every 8 (eight) hours. 05/26/24  Yes Bernardino Ditch, NP  ipratropium (ATROVENT) 0.06 % nasal spray Place 2 sprays into both nostrils 4 (four) times daily. 05/26/24  Yes Bernardino Ditch, NP  promethazine-dextromethorphan (PROMETHAZINE-DM) 6.25-15 MG/5ML syrup Take 5 mLs by mouth 4 (four) times daily as needed. 05/26/24  Yes Bernardino Ditch, NP  escitalopram  (LEXAPRO ) 10 MG tablet Take 1.5 tablets (15 mg total) by mouth daily. 10/10/23   Copland, Jacques, MD  fluticasone  (FLONASE ) 50 MCG/ACT nasal spray Place 2 sprays into both nostrils daily. 08/25/22   Wendee Lynwood HERO, NP  levothyroxine  (SYNTHROID ) 100 MCG tablet Take 100 mcg by mouth daily before breakfast. 08/24/23 08/23/24  [provider]  meclizine  (ANTIVERT ) 12.5 MG tablet Take 1 tablet (12.5 mg total) by mouth 3 (three) times daily as needed for dizziness. 05/26/24   Bernardino Ditch,  NP  rosuvastatin  (CRESTOR ) 20 MG tablet TAKE 1 TABLET(20 MG) BY MOUTH DAILY 08/28/23   Copland, Jacques, MD  valACYclovir  (VALTREX ) 1000 MG tablet Take 2 tablets (2,000 mg total) by mouth 2 (two) times daily. For 24 hours only for fever blisters 02/19/23   Copland, Jacques, MD    Family History Family History  Problem Relation Age of Onset   Alzheimer's disease Father    Cancer Father    Breast cancer Neg Hx     Social History Social History   Tobacco Use   Smoking status: Every Day    Current packs/day: 0.00    Average packs/day: 0.5 packs/day for 33.0 years (16.5 ttl pk-yrs)    Types: Cigarettes    Start date: 09/07/1988    Last attempt to quit: 09/07/2021    Years since quitting: 2.7   Smokeless tobacco: Never  Vaping Use    Vaping status: Former  Substance Use Topics   Alcohol use: Yes    Comment: social   Drug use: No     Allergies   Patient has no known allergies.   Review of Systems Review of Systems  Constitutional:  Negative for fever.  HENT:  Positive for congestion, rhinorrhea, sinus pressure and sinus pain. Negative for ear pain.   Respiratory:  Positive for cough. Negative for shortness of breath and wheezing.   Neurological:  Positive for dizziness.       Intermittent room spinning but not at present.  Hematological:  Positive for adenopathy.     Physical Exam Triage Vital Signs ED Triage Vitals  Encounter Vitals Group     BP 05/26/24 1405 127/84     Girls Systolic BP Percentile --      Girls Diastolic BP Percentile --      Boys Systolic BP Percentile --      Boys Diastolic BP Percentile --      Pulse Rate 05/26/24 1405 73     Resp --      Temp 05/26/24 1405 98.3 F (36.8 C)     Temp Source 05/26/24 1405 Oral     SpO2 05/26/24 1405 95 %     Weight 05/26/24 1404 215 lb (97.5 kg)     Height 05/26/24 1404 5' 7 (1.702 m)     Head Circumference --      Peak Flow --      Pain Score 05/26/24 1404 3     Pain Loc --      Pain Education --      Exclude from Growth Chart --    No data found.  Updated Vital Signs BP 127/84 (BP Location: Left Arm)   Pulse 73   Temp 98.3 F (36.8 C) (Oral)   Ht 5' 7 (1.702 m)   Wt 215 lb (97.5 kg)   LMP 03/09/2017   SpO2 95%   BMI 33.67 kg/m   Visual Acuity Right Eye Distance:   Left Eye Distance:   Bilateral Distance:    Right Eye Near:   Left Eye Near:    Bilateral Near:     Physical Exam Vitals and nursing note reviewed.  Constitutional:      Appearance: Normal appearance. She is ill-appearing.  HENT:     Head: Normocephalic and atraumatic.     Right Ear: Tympanic membrane, ear canal and external ear normal. There is no impacted cerumen.     Left Ear: Tympanic membrane, ear canal and external ear normal. There is no impacted  cerumen.  Nose: Congestion and rhinorrhea present.     Comments: This mucosa is edematous and erythematous with bloody discharge in both nares.  Bilateral tenderness to frontal and maxillary sinuses.    Mouth/Throat:     Mouth: Mucous membranes are moist.     Pharynx: Oropharynx is clear. No oropharyngeal exudate or posterior oropharyngeal erythema.  Neck:     Comments: Bilateral anterior cervical lymphadenopathy that is tender to palpation. Cardiovascular:     Rate and Rhythm: Normal rate and regular rhythm.     Pulses: Normal pulses.     Heart sounds: Normal heart sounds. No murmur heard.    No friction rub. No gallop.  Pulmonary:     Effort: Pulmonary effort is normal.     Breath sounds: Normal breath sounds. No wheezing, rhonchi or rales.  Musculoskeletal:     Cervical back: Normal range of motion and neck supple. Tenderness present.  Lymphadenopathy:     Cervical: Cervical adenopathy present.  Skin:    General: Skin is warm and dry.     Capillary Refill: Capillary refill takes less than 2 seconds.     Findings: No rash.  Neurological:     General: No focal deficit present.     Mental Status: She is alert and oriented to person, place, and time.      UC Treatments / Results  Labs (all labs ordered are listed, but only abnormal results are displayed) Labs Reviewed - No data to display  EKG   Radiology No results found.  Procedures Procedures (including critical care time)  Medications Ordered in UC Medications - No data to display  Initial Impression / Assessment and Plan / UC Course  I have reviewed the triage vital signs and the nursing notes.  Pertinent labs & imaging results that were available during my care of the patient were reviewed by me and considered in my medical decision making (see chart for details).   Patient is a pleasant, though mildly ill-appearing 53 year old female presenting for evaluation of 1 month worth of respiratory symptoms as  outlined HPI above.  She does have edema and erythema of her nasal mucosa with bloody discharge in both naris as well as tenderness to compression of bilateral frontal and maxillary sinuses.  She is also been complaining of intermittent room spinning vertigo type symptoms but none at present.  Otoscopic exam reveals pearly gray tympanic membranes with normal light reflex.  No visible effusion.  Her cardiopulmonary exam is benign.  I suspect, given the degree of inflammation of her upper respiratory tract, that she does have fluid in her middle ear.  I will discharge her home with some meclizine  to help with the middle ear fluid and vertigo-like symptoms.  Additionally, I will start her on Augmentin  875 mg twice daily for 10 days for her sinus infection.  I will prescribe Atrovent nasal spray for the nasal congestion and Tessalon  Perles and Promethazine DM cough syrup for cough and congestion.   Final Clinical Impressions(s) / UC Diagnoses   Final diagnoses:  Acute non-recurrent pansinusitis     Discharge Instructions      The Augmentin  twice daily with food for 10 days for treatment of your sinusitis.  Perform sinus irrigation 2-3 times a day with a NeilMed sinus rinse kit and distilled water.  Do not use tap water.  You can use plain over-the-counter Mucinex every 6 hours to break up the stickiness of the mucus so your body can clear it.  Increase your oral  fluid intake to thin out your mucus so that is also able for your body to clear more easily.  Take an over-the-counter probiotic, such as Culturelle-align-activia, 1 hour after each dose of antibiotic to prevent diarrhea.  Use the meclizine , 12.5 mg every 8 hours, as needed for vertigo-like symptoms.  Use the Atrovent nasal spray, 2 squirts in each nostril every 6 hours, as needed for runny nose and postnasal drip.  Use the Tessalon  Perles every 8 hours during the day.  Take them with a small sip of water.  They may give you some  numbness to the base of your tongue or a metallic taste in your mouth, this is normal.  Use the Promethazine DM cough syrup at bedtime for cough and congestion.  It will make you drowsy so do not take it during the day.  Return for reevaluation or see your primary care provider for any new or worsening symptoms.      ED Prescriptions     Medication Sig Dispense Auth. Provider   amoxicillin -clavulanate (AUGMENTIN ) 875-125 MG tablet Take 1 tablet by mouth every 12 (twelve) hours for 10 days. 20 tablet Bernardino Ditch, NP   benzonatate  (TESSALON ) 100 MG capsule Take 2 capsules (200 mg total) by mouth every 8 (eight) hours. 21 capsule Bernardino Ditch, NP   ipratropium (ATROVENT) 0.06 % nasal spray Place 2 sprays into both nostrils 4 (four) times daily. 15 mL Bernardino Ditch, NP   promethazine-dextromethorphan (PROMETHAZINE-DM) 6.25-15 MG/5ML syrup Take 5 mLs by mouth 4 (four) times daily as needed. 118 mL Bernardino Ditch, NP   meclizine  (ANTIVERT ) 12.5 MG tablet Take 1 tablet (12.5 mg total) by mouth 3 (three) times daily as needed for dizziness. 30 tablet Bernardino Ditch, NP      PDMP not reviewed this encounter.   Bernardino Ditch, NP 05/26/24 1419

## 2024-05-26 NOTE — Discharge Instructions (Addendum)
 The Augmentin  twice daily with food for 10 days for treatment of your sinusitis.  Perform sinus irrigation 2-3 times a day with a NeilMed sinus rinse kit and distilled water.  Do not use tap water.  You can use plain over-the-counter Mucinex every 6 hours to break up the stickiness of the mucus so your body can clear it.  Increase your oral fluid intake to thin out your mucus so that is also able for your body to clear more easily.  Take an over-the-counter probiotic, such as Culturelle-align-activia, 1 hour after each dose of antibiotic to prevent diarrhea.  Use the meclizine , 12.5 mg every 8 hours, as needed for vertigo-like symptoms.  Use the Atrovent nasal spray, 2 squirts in each nostril every 6 hours, as needed for runny nose and postnasal drip.  Use the Tessalon  Perles every 8 hours during the day.  Take them with a small sip of water.  They may give you some numbness to the base of your tongue or a metallic taste in your mouth, this is normal.  Use the Promethazine DM cough syrup at bedtime for cough and congestion.  It will make you drowsy so do not take it during the day.  Return for reevaluation or see your primary care provider for any new or worsening symptoms.

## 2024-05-26 NOTE — Telephone Encounter (Signed)
 FYI Only or Action Required?: Action required by provider: request for appointment.  Patient was last seen in primary care on 10/10/2023 by Watt Mirza, MD.  Called Nurse Triage reporting Chin swelling.  Symptoms began a week ago.  Interventions attempted: Nothing.  Symptoms are: unchanged. Pt. Will go to UC today. New pt., TOC, appointment made.  Triage Disposition: See Physician Within 24 Hours  Patient/caregiver understands and will follow disposition?: Yes    Copied from CRM [id number]. Topic: Clinical - Red Word Triage >> May 26, 2024 11:30 AM Avram MATSU wrote: Red Word that prompted transfer to Nurse Triage: sinus infection that went down to her chin, itchy nose, pimple inside nose,pain on right ear, very swollen chin/neck est care w md zafirov   ----------------------------------------------------------------------- From previous Reason for Contact - Scheduling: Patient/patient representative is calling to schedule an appointment. Refer to attachments for appointment information. Answer Assessment - Initial Assessment Questions 1. ONSET: When did the swelling start? (e.g., minutes, hours, days)     Saturday 2. LOCATION: What part of the face is swollen? (e.g., cheek, entire face, jaw joint area, under jaw)     chin 3. SEVERITY: How swollen is it?     severe 4. ITCHING: Is there any itching? If Yes, ask: How much?   (Scale 1-10; mild, moderate or severe)     no 5. PAIN: Is the swelling painful to touch? If Yes, ask: How painful is it?   (Scale 0-10; mild, moderate or severe)     7-8 when she touches ot 6. FEVER: Do you have a fever? If Yes, ask: What is it, how was it measured, and when did it start?      no 7. CAUSE: What do you think is causing the face swelling?     unsure 8. NEW MEDICINES: Have there been any new medicines started recently?     no 9. RECURRENT SYMPTOM: Have you had face swelling before? If Yes, ask: When was the last  time? What happened that time?     o 10. OTHER SYMPTOMS: Do you have any other symptoms? (e.g., leg swelling, toothache)       no 11. PREGNANCY: Is there any chance you are pregnant? When was your last menstrual period?       no  Protocols used: Face Swelling-A-AH

## 2024-05-26 NOTE — Telephone Encounter (Signed)
 I am happy to see her.  It looks like she is transitioning to a new physician.

## 2024-05-28 ENCOUNTER — Ambulatory Visit
Admission: EM | Admit: 2024-05-28 | Discharge: 2024-05-28 | Disposition: A | Discharge status: Home / Self Care | Attending: Physician Assistant | Admitting: Physician Assistant

## 2024-05-28 DIAGNOSIS — R519 Headache, unspecified: Secondary | ICD-10-CM | POA: Diagnosis not present

## 2024-05-28 DIAGNOSIS — R0981 Nasal congestion: Secondary | ICD-10-CM | POA: Diagnosis not present

## 2024-05-28 DIAGNOSIS — J019 Acute sinusitis, unspecified: Secondary | ICD-10-CM | POA: Diagnosis not present

## 2024-05-28 MED ORDER — PSEUDOEPH-BROMPHEN-DM 30-2-10 MG/5ML PO SYRP: 10.0000 mL | ORAL_SOLUTION | Freq: Four times a day (QID) | ORAL | 0 refills | Status: AC | PRN

## 2024-05-28 NOTE — Discharge Instructions (Signed)
-   I did not see any evidence of rash on your scalp, neck, inside your ear, and throat, shoulder and chest.  Monitor this daily and if you see rash need to be seen again and treated for potential shingles. - At this time continue Augmentin  and your nasal spray.  I sent a decongestant to the pharmacy. - Continue ibuprofen  and Tylenol  in case her symptoms are related to a migraine or sinus type headache. - If at any point your symptoms acutely worsen you should go to the emergency department.

## 2024-05-28 NOTE — ED Provider Notes (Signed)
 [facility]    CSN: [id number] Arrival date & time: 05/28/24  1320      History   Chief Complaint Chief Complaint  Patient presents with   Headache   Sore Throat    HPI Angel Hernandez is a 53 y.o. female with history of GERD, hypothyroidism, migraines, thyroid  cancer, and tobacco abuse presents for right sided headache/scalp pain, sore throat, right sided ear pain, and congestion. She says all of the headaches and scalp pain started 2 days ago. She says it feels like when she had shingles but she has not noticed a rash.   She was seen here 2 days ago and diagnosed with acute sinusitis. Placed on Augmentin . Has had 1 month history of intermittent nasal congestion and bloody/purulent nasal drainage.   Patient has been taking antibiotics and using nasal spray and is not feeling better over the past 2 days. She has also been taking Tylenol  and Motrin . At this time, she is most concerned about shingles.   HPI  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   Post-operative hypothyroidism    Tobacco abuse    Thyroid  cancer [facility])     Past Surgical History:  Procedure Laterality Date   BILATERAL SALPINGECTOMY Bilateral 03/19/2017   Procedure: BILATERAL SALPINGECTOMY;  Surgeon: Ward, Mitzie BROCKS, MD;  Location: [facility];  Service: Gynecology;  Laterality: Bilateral;   CESAREAN SECTION     CYSTOSCOPY N/A 03/19/2017   Procedure: CYSTOSCOPY;  Surgeon: Ward, Mitzie BROCKS, MD;  Location: [facility];  Service: Gynecology;  Laterality: N/A;   DILATION AND CURETTAGE OF UTERUS     KNEE ARTHROSCOPY Left    thyroidectomy  2001    VAGINAL HYSTERECTOMY N/A 03/19/2017   Procedure: HYSTERECTOMY VAGINAL;  Surgeon: Ward, Mitzie BROCKS, MD;  Location: [facility];  Service: Gynecology;  Laterality: N/A;    OB History     Gravida  4   Para  4   Term  4   Preterm      AB      Living  4      SAB      IAB      Ectopic      Multiple      Live Births               Home Medications    Prior to Admission medications   Medication Sig Start Date End Date Taking? Authorizing Provider  brompheniramine-pseudoephedrine-DM 30-2-10 MG/5ML syrup Take 10 mLs by mouth 4 (four) times daily as needed for up to 7 days. 05/28/24 06/04/24 Yes Arvis Huxley B, PA-C  amoxicillin -clavulanate (AUGMENTIN ) 875-125 MG tablet Take 1 tablet by mouth every 12 (twelve) hours for 10 days. 05/26/24 06/05/24  Bernardino Ditch, NP  escitalopram  (LEXAPRO ) 10 MG tablet Take 1.5 tablets (15 mg total) by mouth daily. 10/10/23   Copland, Jacques, MD  fluticasone  (FLONASE ) 50 MCG/ACT nasal spray Place 2 sprays into both nostrils daily. 08/25/22   Wendee Lynwood HERO, NP  ipratropium (ATROVENT) 0.06 % nasal spray Place 2 sprays into both nostrils 4 (four) times daily. 05/26/24  Bernardino Ditch, NP  levothyroxine  (SYNTHROID ) 100 MCG tablet Take 100 mcg by mouth daily before breakfast. 08/24/23 08/23/24  [provider]  meclizine  (ANTIVERT ) 12.5 MG tablet Take 1 tablet (12.5 mg total) by mouth 3 (three) times daily as needed for dizziness. 05/26/24   Bernardino Ditch, NP  rosuvastatin  (CRESTOR ) 20 MG tablet TAKE 1 TABLET(20 MG) BY MOUTH DAILY 08/28/23   Copland, Jacques, MD  valACYclovir  (VALTREX ) 1000 MG tablet Take 2 tablets (2,000 mg total) by mouth 2 (two) times daily. For 24 hours only for fever blisters 02/19/23   Copland, Jacques, MD    Family History Family History  Problem Relation Age of Onset   Alzheimer's disease Father    Cancer Father    Breast cancer Neg Hx     Social History Social History   Tobacco Use   Smoking status: Every Day     Current packs/day: 0.00    Average packs/day: 0.5 packs/day for 33.0 years (16.5 ttl pk-yrs)    Types: Cigarettes    Start date: 09/07/1988    Last attempt to quit: 09/07/2021    Years since quitting: 2.7   Smokeless tobacco: Never  Vaping Use   Vaping status: Former  Substance Use Topics   Alcohol use: Yes    Comment: social   Drug use: No     Allergies   Patient has no known allergies.   Review of Systems Review of Systems  Constitutional:  Positive for fatigue. Negative for chills, diaphoresis and fever.  HENT:  Positive for congestion, ear pain, rhinorrhea, sinus pressure, sinus pain and sore throat.   Respiratory:  Negative for cough and shortness of breath.   Cardiovascular:  Negative for chest pain.  Gastrointestinal:  Negative for abdominal pain, nausea and vomiting.  Musculoskeletal:  Positive for neck pain. Negative for arthralgias and myalgias.  Skin:  Negative for rash.  Neurological:  Positive for headaches. Negative for dizziness and weakness.  Hematological:  Negative for adenopathy.     Physical Exam Triage Vital Signs ED Triage Vitals [05/28/24 1334]  Encounter Vitals Group     BP      Girls Systolic BP Percentile      Girls Diastolic BP Percentile      Boys Systolic BP Percentile      Boys Diastolic BP Percentile      Pulse      Resp      Temp      Temp src      SpO2      Weight 215 lb (97.5 kg)     Height 5' 7 (1.702 m)     Head Circumference      Peak Flow      Pain Score 7     Pain Loc      Pain Education      Exclude from Growth Chart    No data found.  Updated Vital Signs BP 131/85 (BP Location: Left Arm)   Pulse 70   Temp 98.7 F (37.1 C) (Oral)   Resp 14   Ht 5' 7 (1.702 m)   Wt 215 lb (97.5 kg)   LMP 03/09/2017   SpO2 98%   BMI 33.67 kg/m    Physical Exam Vitals and nursing note reviewed.  Constitutional:      General: She is not in acute distress.    Appearance: Normal appearance. She is not ill-appearing or  toxic-appearing.  HENT:     Head: Normocephalic and atraumatic.  Comments: Inspected right scalp, neck, right shoulder, upper chest and no rash noted    Right Ear: Ear canal and external ear normal. A middle ear effusion is present.     Left Ear: Tympanic membrane, ear canal and external ear normal.     Nose: Congestion present.     Mouth/Throat:     Mouth: Mucous membranes are moist.     Pharynx: Oropharynx is clear. Posterior oropharyngeal erythema present.  Eyes:     General: No scleral icterus.       Right eye: No discharge.        Left eye: No discharge.     Conjunctiva/sclera: Conjunctivae normal.  Cardiovascular:     Rate and Rhythm: Normal rate and regular rhythm.     Heart sounds: Normal heart sounds.  Pulmonary:     Effort: Pulmonary effort is normal. No respiratory distress.     Breath sounds: Normal breath sounds.  Musculoskeletal:     Cervical back: Neck supple.  Skin:    General: Skin is dry.  Neurological:     General: No focal deficit present.     Mental Status: She is alert. Mental status is at baseline.     Motor: No weakness.     Gait: Gait normal.  Psychiatric:        Mood and Affect: Mood normal.        Behavior: Behavior normal.      UC Treatments / Results  Labs (all labs ordered are listed, but only abnormal results are displayed) Labs Reviewed - No data to display  EKG   Radiology No results found.  Procedures Procedures (including critical care time)  Medications Ordered in UC Medications - No data to display  Initial Impression / Assessment and Plan / UC Course  I have reviewed the triage vital signs and the nursing notes.  Pertinent labs & imaging results that were available during my care of the patient were reviewed by me and considered in my medical decision making (see chart for details).   53 y/o female presents for right sided headache/scalp pain x 2 days. Seen here 2 days ago and diagnosed with sinus infection after  reporting 1 month history of intermittent nasal congestion and sinus pain. Has been taking Augmentin  and using nasal spray x 2 days without relief. Concern for shingles.   Vitals normal and stable. Inspected scalp, neck, shoulder, and upper chest and no sign of rash. She does have effusion of right TM, nasal congestion and mild erythema of posterior pharynx. Chest is clear.   Today's exam not consistent with shingles as she has no evidence of rash to accompany the headache and scalp pain.  Explained symptoms could be related to migraines or sinus headache.  Offered to change antibiotic to doxycycline since she has not gotten better in a couple of days but she declines.  Offered Toradol  injection and treatment for migraine but she declines.  Patient plans to continue Augmentin  and nasal spray.  I sent Bromfed-DM for her to try instead of promethazine DM and benzonatate .  Encouraged nasal saline rinses, continue ibuprofen  and Tylenol .  Reviewed returning if she sees a rash consistent with shingles.  Reviewed follow-up here as needed.   Final Clinical Impressions(s) / UC Diagnoses   Final diagnoses:  Acute sinusitis, recurrence not specified, unspecified location  Right-sided headache  Nasal congestion     Discharge Instructions      - I did not see any evidence of rash on  your scalp, neck, inside your ear, and throat, shoulder and chest.  Monitor this daily and if you see rash need to be seen again and treated for potential shingles. - At this time continue Augmentin  and your nasal spray.  I sent a decongestant to the pharmacy. - Continue ibuprofen  and Tylenol  in case her symptoms are related to a migraine or sinus type headache. - If at any point your symptoms acutely worsen you should go to the emergency department.     ED Prescriptions     Medication Sig Dispense Auth. Provider   brompheniramine-pseudoephedrine-DM 30-2-10 MG/5ML syrup Take 10 mLs by mouth 4 (four) times daily as needed  for up to 7 days. 150 mL Arvis Jolan NOVAK, PA-C      PDMP not reviewed this encounter.   Arvis Jolan NOVAK, PA-C 05/28/24 [phone]

## 2024-05-28 NOTE — ED Triage Notes (Signed)
 Pt c/o scalp pain,R sided HA & tingling in ear. States feels similar when she had shingles. Denies any rashes.

## 2024-06-13 ENCOUNTER — Ambulatory Visit (INDEPENDENT_AMBULATORY_CARE_PROVIDER_SITE_OTHER)

## 2024-06-13 VITALS — BP 111/52 | HR 82 | Ht 66.0 in | Wt 221.2 lb

## 2024-06-13 DIAGNOSIS — Z720 Tobacco use: Secondary | ICD-10-CM | POA: Diagnosis not present

## 2024-06-13 DIAGNOSIS — E66812 Other obesity due to excess calories: Secondary | ICD-10-CM

## 2024-06-13 DIAGNOSIS — F3342 Major depressive disorder, recurrent, in full remission: Secondary | ICD-10-CM

## 2024-06-13 DIAGNOSIS — Z6835 Body mass index (BMI) 35.0-35.9, adult: Secondary | ICD-10-CM

## 2024-06-13 DIAGNOSIS — R011 Cardiac murmur, unspecified: Secondary | ICD-10-CM | POA: Insufficient documentation

## 2024-06-13 DIAGNOSIS — C73 Malignant neoplasm of thyroid gland: Secondary | ICD-10-CM

## 2024-06-13 DIAGNOSIS — E669 Obesity, unspecified: Secondary | ICD-10-CM | POA: Insufficient documentation

## 2024-06-13 DIAGNOSIS — E6609 Other obesity due to excess calories: Secondary | ICD-10-CM

## 2024-06-13 DIAGNOSIS — Z8639 Personal history of other endocrine, nutritional and metabolic disease: Secondary | ICD-10-CM | POA: Insufficient documentation

## 2024-06-13 DIAGNOSIS — E890 Postprocedural hypothyroidism: Secondary | ICD-10-CM

## 2024-06-13 DIAGNOSIS — B070 Plantar wart: Secondary | ICD-10-CM | POA: Insufficient documentation

## 2024-06-13 NOTE — Progress Notes (Signed)
 New Patient Visit   Physician: Lashante Fryberger A Margeart Allender, MD  Patient: Angel Hernandez   DOB: 04/24/1971   53 y.o. Female  MRN: [id number] Visit Date: 06/13/2024   Chief Complaint  Patient presents with   Establish Care   Subjective  Angel Hernandez is a 53 y.o. female who presents today as a new patient to establish care.   HPI  Discussed the use of AI scribe software for clinical note transcription with the patient, who gave verbal consent to proceed.  History of Present Illness   Angel Hernandez is a 53 year old female who presents for a routine follow-up.  Thyroid  cancer status and weight gain - History of thyroid  cancer status post resection - Currently on levothyroxine  100 mcg daily, managed by endocrinology - Recent weight increase; levothyroxine  dosage adjusted by endocrinologist  Obesity - Significant weight gain since liposuction several years ago, current weight 221 pounds, BMI 35.7  - Diet improving, does not exercise due to plantar warts  Anxiety symptoms - Anxiety previously more pronounced, currently well controlled - Managed with escitalopram  (Lexapro ) 10 mg daily with good effect - Increased anxiety and stress contributed to resumption of smoking after a prior quit attempt  Tobacco use - Current smoker, duration of smoking history unknown - Previously quit for two months, resumed due to increased anxiety and stress - Smokes outside - No history of lung cancer screening CT - declines  Plantar warts and exercise limitation - Plantar warts on feet causing significant pain - Difficulty exercising due to pain - Warts have been resistant to multiple treatments, including surgery and topical gels  Gynecologic and menopausal symptoms - History of hysterectomy - Low libido, attributed to medication or hormonal changes - Experiences hot flashes - No significant mood swings  Allergic rhinitis - Uses Flonase  as needed for rhinitis  Recent acute illness -  Recent urgent care visit for unspecified issue, treated with antibiotics         ASSESSMENT & PLAN  Encounter Diagnoses  Name Primary?   Recurrent major depression in full remission Yes   Post-operative hypothyroidism    Tobacco abuse    Class 2 obesity due to excess calories without serious comorbidity with body mass index (BMI) of 35.0 to 35.9 in adult    Thyroid  cancer (HCC)    Plantar warts    History of iron deficiency    Cardiac murmur     Orders Placed This Encounter  Procedures   CBC with Differential/Platelet   Comprehensive metabolic panel with GFR   Hemoglobin A1c   Lipid panel   Urinalysis, Routine w reflex microscopic   Iron, TIBC and Ferritin Panel   Ambulatory referral to Podiatry   ECHOCARDIOGRAM COMPLETE    Assessment and Plan    Tobacco use disorder Chronic tobacco use with no current plans to quit. Discussed health risks including COPD and lung cancer. Discussed quitting strategies, including Wellbutrin and nicotine replacement. - Encouraged reduction to half a pack per day. - Discussed potential use of Wellbutrin and nicotine replacement for future quit attempts. Smoking/Tobacco Cessation Counseling [name] is a current user of tobacco or nicotine products. She is not ready to quit at this time. Counseling provided today addressed the risks of continued use and the benefits of cessation. Discussed tobacco/nicotine use history, readiness to quit, and evidence-based treatment options including behavioral strategies, support resources, and pharmacologic therapies. Provided encouragement and educational materials on steps and resources to quit smoking. Patient questions  were addressed, and follow-up recommended for continued support. Total time spent on counseling: 10 minutes.    Obesity, class 2 Class 2 obesity with significant weight gain. Difficulty exercising due to plantar warts and corns. Discussed dietary changes and medication impact on  weight. - Encouraged continuation of dietary changes.  Major depressive disorder, single episode, in remission on escitalopram  Discussed potential side effects including weight gain and low libido. - Continue escitalopram  10 mg daily.  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 Previous treatments ineffective. Discussed podiatry referral.  Menopausal symptoms post-hysterectomy (hot flashes, low libido) Post-hysterectomy menopausal symptoms including hot flashes and low libido. Discussed Wellbutrin for low libido. Estrogen therapy not recommended due to current smoking  - Will consider Wellbutrin for low libido.  Cardiac murmur, etiology to be determined New cardiac murmur. Discussed potential causes including valve issues. Plan for echocardiogram to assess valve function and heart structure. - Ordered echocardiogram.  Allergic rhinitis, intermittent Intermittent allergic rhinitis managed with as-needed Flonase . Recent ear infection treated with antibiotics, residual fluid noted. - Continue as-needed use of Flonase .  Lipoma, chest wall Lipoma on chest wall, likely post-trauma from liposuction. Patient reports pain and stinging at the site. - Continue to monitor for changes.  General Health Maintenance Discussed health maintenance including flu vaccination and lung cancer screening due to smoking history. Declines flu vaccination.   Will f/u with her re ECHO results otherwise March fu with labs.       Objective  BP (!) 111/52   Pulse 82   Ht 5' 6 (1.676 m)   Wt 221 lb 3.2 oz (100.3 kg)   LMP 03/09/2017   SpO2 95%   BMI 35.70 kg/m      Review of Systems  Constitutional:  Negative for chills, fever and weight loss.  Eyes:  Negative for blurred vision. h Respiratory:  Negative for cough and shortness of breath.   Cardiovascular:  Negative for chest pain and palpitations.  Skin:  Negative  for rash.  Psychiatric/Behavioral:  Negative for depression. The patient is not nervous/anxious.      Physical Exam Physical Exam Vitals reviewed.  Constitutional:      Appearance: Normal appearance. Well-developed with normal weight.  HENT:     Head: Normocephalic and atraumatic.  Normal mucous membranes, no oral lesions Eyes:     Pupils: Pupils are equal, round, and reactive to light.  Neck:     Thyroid : No thyroid  mass or thyromegaly.  Cardiovascular:     Rate and Rhythm: Normal rate and regular rhythm. LUSB Grade III systolic murmur Normal peripheral pulses Pulmonary:     Normal breath sounds with normal effort Abdominal:   Abdomen is soft, without tenderness or noted hepatosplenomegaly Musculoskeletal:        General: No swelling or edema  Lymphadenopathy:     Cervical: No cervical adenopathy.  Skin:    General: Skin is warm and dry without noticeable rash. Neurological:     General: No focal deficit present.  Psychiatric:        Mood and Affect: Mood, behavior and cognition normal   Past Medical History:  Diagnosis Date   GERD (gastroesophageal reflux disease)    Hypothyroid    Migraine headache    Thyroid  cancer (HCC) 2001   Tobacco abuse    Past Surgical History:  Procedure Laterality Date   BILATERAL SALPINGECTOMY Bilateral 03/19/2017   Procedure: BILATERAL SALPINGECTOMY;  Surgeon: Neomi Mitzie BROCKS, MD;  Location: [facility];  Service: Gynecology;  Laterality: Bilateral;   CESAREAN SECTION     CYSTOSCOPY N/A 03/19/2017   Procedure: CYSTOSCOPY;  Surgeon: Ward, Mitzie BROCKS, MD;  Location: [facility];  Service: Gynecology;  Laterality: N/A;   DILATION AND CURETTAGE OF UTERUS     KNEE ARTHROSCOPY Left    thyroidectomy  2001   VAGINAL HYSTERECTOMY N/A 03/19/2017   Procedure: HYSTERECTOMY VAGINAL;  Surgeon: Ward, Mitzie BROCKS, MD;  Location: [facility];  Service: Gynecology;  Laterality: N/A;   Family Status  Relation Name Status   Mother  Alive   Father  Deceased   Neg  Hx  (Not Specified)  No partnership data on file   Family History  Problem Relation Age of Onset   Alzheimer's disease Father    Cancer Father    Breast cancer Neg Hx    Social History   Socioeconomic History   Marital status: Married    Spouse name: Not on file   Number of children: Not on file   Years of education: Not on file   Highest education level: Not on file  Occupational History   Occupation: Event Organiser: [employer]    Comment: [employer] in [place]  Tobacco Use   Smoking status: Every Day    Current packs/day: 0.00    Average packs/day: 0.5 packs/day for 33.0 years (16.5 ttl pk-yrs)    Types: Cigarettes    Start date: 09/07/1988    Last attempt to quit: 09/07/2021    Years since quitting: 2.7   Smokeless tobacco: Never  Vaping Use   Vaping status: Former  Substance and Sexual Activity   Alcohol use: Yes    Comment: social   Drug use: No   Sexual activity: Yes    Partners: Male  Other Topics Concern   Not on file  Social History Narrative   4 children - 20, 17, 12, 9.   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   fluticasone  (FLONASE ) 50 MCG/ACT nasal spray Place 2 sprays into both nostrils daily.   levothyroxine  (SYNTHROID ) 100 MCG tablet Take 100 mcg by mouth daily before breakfast.   meclizine  (ANTIVERT ) 12.5 MG tablet Take 1 tablet (12.5 mg total) by mouth 3 (three) times daily as needed for dizziness.   ipratropium (ATROVENT) 0.06 % nasal spray Place 2 sprays into both nostrils 4 (four) times daily. (Patient not taking: Reported on  06/13/2024)   valACYclovir  (VALTREX ) 1000 MG tablet Take 2 tablets (2,000 mg total) by mouth 2 (two) times daily. For 24 hours only for fever blisters (Patient not taking: Reported on 06/13/2024)   [DISCONTINUED] rosuvastatin  (CRESTOR ) 20 MG tablet TAKE 1 TABLET(20 MG) BY MOUTH DAILY (Patient not taking: Reported on 06/13/2024)   No facility-administered medications prior to visit.   No Known Allergies  Immunization History  Administered Date(s) Administered   Influenza Inj Mdck Quad Pf 05/14/2018   Tdap 09/22/2015    Health Maintenance  Topic Date Due   COVID-19 Vaccine (1) Never done  Pneumococcal Vaccine: 50+ Years (1 of 2 - PCV) Never done   Hepatitis B Vaccines 19-59 Average Risk (1 of 3 - 19+ 3-dose series) Never done   Zoster Vaccines- Shingrix (1 of 2) Never done   Mammogram  10/02/2021   Influenza Vaccine  02/29/2024   Fecal DNA (Cologuard)  09/19/2025   DTaP/Tdap/Td (2 - Td or Tdap) 09/21/2025   Hepatitis C Screening  Completed   HIV Screening  Completed   HPV VACCINES  Aged Out   Meningococcal B Vaccine  Aged Out    Patient Care Team: Watt Mirza, MD as PCP - General (Family Medicine) Cleotilde Elma DEL, MD as Consulting Physician (Endocrinology)  Depression Screen    06/13/2024    1:44 PM 10/10/2023    8:45 AM 09/06/2022    3:22 PM 09/21/2021   12:22 PM  PHQ 2/9 Scores  PHQ - 2 Score 0 0 0 0  PHQ- 9 Score 0 1  0  1      Data saved with a previous flowsheet row definition     Parris DELENA Juneau, MD  [facility] [phone] (phone) [phone] (fax)  [facility]

## 2024-06-25 ENCOUNTER — Ambulatory Visit: Payer: Self-pay

## 2024-06-25 DIAGNOSIS — M216X2 Other acquired deformities of left foot: Secondary | ICD-10-CM

## 2024-06-25 DIAGNOSIS — M216X1 Other acquired deformities of right foot: Secondary | ICD-10-CM

## 2024-06-25 DIAGNOSIS — L989 Disorder of the skin and subcutaneous tissue, unspecified: Secondary | ICD-10-CM

## 2024-06-25 DIAGNOSIS — Z9889 Other specified postprocedural states: Secondary | ICD-10-CM

## 2024-06-25 DIAGNOSIS — M21961 Unspecified acquired deformity of right lower leg: Secondary | ICD-10-CM

## 2024-06-25 DIAGNOSIS — M21962 Unspecified acquired deformity of left lower leg: Secondary | ICD-10-CM

## 2024-06-25 NOTE — Progress Notes (Signed)
 Subjective:  Patient ID: Angel Hernandez, female    DOB: [birth year],  MRN: [id number]  Chief Complaint  Patient presents with   Plantar Warts    Rm 2 Plantar warts bilateral sub 5th mets/ hurts constantly with pressure and shoes/ OTC treatment with no relief.    Discussed the use of AI scribe software for clinical note transcription with the patient, who gave verbal consent to proceed.  History of Present Illness Angel Hernandez is a 53 year old female who presents with painful lesions on the bottom of her foot.  She has had painful plantar foot lesions since age 34. She underwent surgery on the left foot between ages 48 and 22 to move the fifth toe and remove part of the lesion, but the lesions recurred. She now trims them herself for relief but is concerned she will not be able to continue due to pain.  She has tried acid, gels, duct tape, and orthotics. The acid and gels loosen the lesions for trimming but do not resolve them. Orthotics previously offloaded the area and reduced pain, but she lost them and has not replaced them. Shoes with arches help somewhat but cause heel pain.  The lesions cause significant pain with walking and exercise and limit her physical activity. 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  Lotions with urea help loosen the lesions but do not provide meaningful pain relief.  She alters her gait to avoid pressure on the lesions, which leads to discomfort in her hips and other areas.     Review of Systems: Negative except as noted in the HPI. Denies N/V/F/Ch.  Past Medical History:  Diagnosis Date   GERD (gastroesophageal reflux disease)    Hypothyroid    Migraine headache    Thyroid  cancer (HCC) 2001   Tobacco abuse     Current Outpatient Medications:    escitalopram  (LEXAPRO ) 10 MG tablet, Take 1.5 tablets (15 mg total) by mouth daily., Disp: 135 tablet, Rfl: 3   fluticasone  (FLONASE ) 50  MCG/ACT nasal spray, Place 2 sprays into both nostrils daily., Disp: 16 g, Rfl: 0   levothyroxine  (SYNTHROID ) 100 MCG tablet, Take 100 mcg by mouth daily before breakfast., Disp: , Rfl:    meclizine  (ANTIVERT ) 12.5 MG tablet, Take 1 tablet (12.5 mg total) by mouth 3 (three) times daily as needed for dizziness., Disp: 30 tablet, Rfl: 0   ipratropium (ATROVENT ) 0.06 % nasal spray, Place 2 sprays into both nostrils 4 (four) times daily. (Patient not taking: Reported on 06/25/2024), Disp: 15 mL, Rfl: 12   valACYclovir  (VALTREX ) 1000 MG tablet, Take 2 tablets (2,000 mg total) by mouth 2 (two) times daily. For 24 hours only for fever blisters (Patient not taking: Reported on 06/25/2024), Disp: 28 tablet, Rfl: 0  Social History   Tobacco Use  Smoking Status Every Day   Current packs/day: 0.00   Average packs/day: 0.5 packs/day for 33.0 years (16.5 ttl pk-yrs)   Types: Cigarettes   Start date: 09/07/1988   Last attempt to quit: 09/07/2021   Years since quitting: 2.8  Smokeless Tobacco Never    No Known Allergies Objective:   Constitutional Well developed. Well nourished. Oriented to person, place, and time.  Vascular Dorsalis pedis pulses palpable bilaterally. Posterior tibial pulses palpable bilaterally. Capillary refill normal to all digits.  No cyanosis or clubbing noted. Pedal hair growth normal.  Neurologic Normal speech. Epicritic sensation to light touch grossly intact bilaterally.  Negative tinel sign at tarsal tunnel bilaterally.   Dermatologic Skin texture and turgor are within normal limits.  No open wounds. Hyperkeratotic skin lesions present to plantar 5th metatarsal heads bilaterally. Central core present. Unable to further evaluate due to debridement refusal. Pain to palpation.  Hyperkeratotic skin lesions without central core noted to medial hallux IPJ bilateral. Pain to palpation.   Musculoskeletal: 5/5 muscle strength to all major pedal muscle groups. Mild fat pad atrophy  present bilateral. Left 5th toe adductovarus rotation. Prominent plantar positioning of 5th metatarsal head.      Assessment:   1. Benign skin lesion   2. Status post left foot surgery   3. Deformity of metatarsal bone of left foot   4. Deformity of metatarsal bone of right foot   5. Prominent metatarsal head, right   6. Prominent metatarsal head, left      Plan:  Patient was evaluated and treated and all questions answered.  Assessment and Plan Assessment & Plan Plantar lesions of right foot Chronic painful calluses interfering with daily activities. Painful calluses caused by increased 5th metatarsal head pressure. Previous treatments included surgery and orthotics. Discussed surgical option of fifth metatarsal head removal if conservative measures fail. - Patient refused debridement of lesions today. Local anesthetic block offered, patient refused.  - Ordered custom orthotics to offload pressure. - Provided temporary pads until orthotics are ready. - Scheduled follow-up a few weeks after orthotics are received to assess effectiveness. - Discussed potential surgery if orthotics fail.  Surgery would likely entail bilateral fifth metatarsal head excision with lesion excision.    Return to clinic after she receives her orthotics with preclinical x-rays of bilateral feet   Prentice Ovens, DPM [facility]

## 2024-07-22 ENCOUNTER — Ambulatory Visit

## 2024-07-22 DIAGNOSIS — R011 Cardiac murmur, unspecified: Secondary | ICD-10-CM

## 2024-07-22 LAB — ECHOCARDIOGRAM COMPLETE
AR max vel: 2.2 cm2
AV Area VTI: 2.29 cm2
AV Area mean vel: 2.16 cm2
AV Mean grad: 4 mmHg
AV Peak grad: 8.1 mmHg
Ao pk vel: 1.42 m/s
Area-P 1/2: 3.31 cm2
Calc EF: 53.3 %
S' Lateral: 2.7 cm
Single Plane A2C EF: 52.3 %
Single Plane A4C EF: 52.5 %

## 2024-07-28 ENCOUNTER — Ambulatory Visit: Payer: Self-pay

## 2024-08-07 ENCOUNTER — Ambulatory Visit (INDEPENDENT_AMBULATORY_CARE_PROVIDER_SITE_OTHER)

## 2024-08-07 VITALS — BP 130/90 | HR 74 | Ht 66.0 in | Wt 222.6 lb

## 2024-08-07 DIAGNOSIS — I340 Nonrheumatic mitral (valve) insufficiency: Secondary | ICD-10-CM | POA: Diagnosis not present

## 2024-08-07 DIAGNOSIS — B070 Plantar wart: Secondary | ICD-10-CM

## 2024-08-07 DIAGNOSIS — E6609 Other obesity due to excess calories: Secondary | ICD-10-CM

## 2024-08-07 DIAGNOSIS — Z8639 Personal history of other endocrine, nutritional and metabolic disease: Secondary | ICD-10-CM | POA: Diagnosis not present

## 2024-08-07 DIAGNOSIS — C73 Malignant neoplasm of thyroid gland: Secondary | ICD-10-CM

## 2024-08-07 DIAGNOSIS — E66812 Obesity, class 2: Secondary | ICD-10-CM | POA: Diagnosis not present

## 2024-08-07 DIAGNOSIS — R0609 Other forms of dyspnea: Secondary | ICD-10-CM | POA: Diagnosis not present

## 2024-08-07 DIAGNOSIS — Z720 Tobacco use: Secondary | ICD-10-CM | POA: Diagnosis not present

## 2024-08-07 DIAGNOSIS — F3342 Major depressive disorder, recurrent, in full remission: Secondary | ICD-10-CM | POA: Diagnosis not present

## 2024-08-07 DIAGNOSIS — Z6835 Body mass index (BMI) 35.0-35.9, adult: Secondary | ICD-10-CM

## 2024-08-07 DIAGNOSIS — E890 Postprocedural hypothyroidism: Secondary | ICD-10-CM

## 2024-08-07 MED ORDER — NICOTINE 21 MG/24HR TD PT24: 21.0000 mg | MEDICATED_PATCH | TRANSDERMAL | 0 refills | Status: AC

## 2024-08-07 NOTE — Progress Notes (Signed)
" ° ° ° ° ° ° ° ° ° ° ° ° ° ° ° ° ° ° ° ° ° ° ° ° ° ° ° ° ° ° ° ° ° ° ° ° ° ° ° ° ° ° ° ° ° ° ° ° ° ° ° ° ° ° ° ° ° ° ° ° ° ° ° ° ° ° ° ° ° ° ° ° ° ° ° ° ° ° ° ° ° ° ° ° ° ° ° ° ° ° ° ° ° ° ° ° ° ° ° ° ° ° ° ° ° ° ° ° ° ° ° ° ° ° ° ° ° ° ° ° ° ° ° ° ° ° ° ° ° ° ° ° ° ° ° ° ° ° ° ° ° ° ° ° ° ° ° ° ° ° ° ° ° ° ° ° ° ° ° ° ° ° ° ° ° ° ° ° ° ° ° ° ° ° ° ° ° ° ° ° ° ° ° ° ° ° ° ° ° ° ° ° ° ° ° ° ° ° ° ° ° ° ° ° ° ° ° ° ° ° ° ° ° ° ° ° ° ° ° ° ° ° ° ° ° ° ° ° ° ° ° ° ° ° ° ° ° ° ° ° ° ° ° ° ° ° ° ° ° ° ° ° ° ° ° ° ° ° ° ° ° ° ° ° ° ° ° ° ° ° ° ° ° ° ° ° ° ° ° ° ° ° ° ° ° ° ° ° ° ° ° ° ° ° ° ° ° ° ° ° ° ° ° ° ° ° ° ° ° ° ° ° ° ° ° ° ° ° ° ° ° ° ° ° ° ° ° ° ° ° ° ° ° ° ° ° ° ° ° ° ° ° ° ° ° ° ° ° ° ° ° ° ° ° ° ° ° ° ° ° ° ° ° ° ° ° ° ° ° ° ° ° ° ° ° ° ° ° ° ° ° ° ° ° ° ° ° ° ° ° ° ° ° ° ° ° ° ° ° ° ° ° ° ° ° ° ° ° ° ° ° ° ° ° ° ° ° ° ° ° ° ° ° ° ° ° ° ° ° ° ° ° ° ° ° ° ° ° ° ° ° ° ° ° ° ° ° ° ° ° ° ° ° ° ° ° ° ° ° ° ° ° ° ° ° ° ° ° ° ° ° ° ° ° ° ° ° ° ° ° ° ° ° ° ° ° ° ° ° ° ° ° ° ° ° ° ° ° ° ° ° ° ° ° ° ° ° ° ° ° ° ° ° ° ° ° ° ° ° ° ° ° ° ° ° ° ° ° ° ° ° ° ° ° ° ° ° ° ° ° ° ° ° ° ° ° ° ° ° ° ° ° ° ° ° ° ° ° ° ° ° ° ° ° ° ° ° ° ° ° ° ° ° ° ° ° ° ° ° ° ° ° ° ° ° ° ° ° ° ° ° ° ° ° ° ° ° ° ° ° ° ° ° ° ° ° ° ° ° ° ° ° ° ° ° ° ° ° ° ° ° ° ° ° ° ° ° ° ° ° ° ° ° ° ° ° ° ° ° ° ° ° ° ° ° ° ° ° ° ° ° ° ° ° ° ° ° ° ° ° ° ° ° ° ° ° ° ° ° ° ° ° ° ° ° ° ° ° ° ° ° ° ° ° ° ° ° ° ° ° ° ° ° ° ° ° ° ° ° ° ° ° ° ° ° ° ° ° ° ° ° ° ° ° ° ° ° ° ° ° ° ° ° ° ° ° ° ° °+++ ° ° ° ° ° ° ° ° ° ° ° ° ° ° ° ° ° ° ° ° ° ° ° ° ° ° ° ° ° ° ° ° ° ° ° ° ° ° ° ° ° ° ° ° ° ° ° ° ° ° ° ° ° ° ° ° ° ° ° ° ° ° ° ° ° ° ° ° ° ° ° ° ° ° ° ° ° ° ° ° ° ° ° ° ° ° ° ° ° ° ° ° ° ° ° ° ° ° ° ° ° ° ° ° ° ° ° ° ° ° ° ° ° ° ° ° ° ° ° ° ° ° ° ° ° ° ° ° ° ° ° ° ° ° ° ° ° ° ° ° ° ° ° ° ° ° ° ° ° ° ° ° ° ° ° ° ° ° ° ° ° ° ° ° ° ° ° ° ° ° ° ° ° ° ° ° ° ° ° ° ° ° ° ° ° ° ° ° ° ° ° ° ° ° ° ° ° ° ° ° ° ° ° ° ° ° ° ° ° ° ° ° ° ° ° ° ° ° ° ° ° ° ° ° ° ° ° ° ° ° ° ° ° ° ° ° ° ° ° ° ° ° ° °  ° ° ° ° ° ° ° ° ° ° ° ° ° ° ° ° ° ° ° ° ° ° ° ° ° ° ° ° ° ° ° ° ° ° ° ° ° ° ° ° ° ° ° ° ° ° ° ° ° ° ° ° ° ° ° ° ° ° ° ° ° ° ° ° ° ° ° ° ° ° ° ° ° ° ° ° ° ° ° ° ° ° ° ° ° ° ° ° ° °          Progress Note  Physician: Danniel Tones A Renesha Lizama, MD   HPI: Angel Hernandez is a 54 y.o. female presenting on 08/07/2024 for Follow-up .  Discussed the use of AI scribe software for clinical note transcription with the patient, who gave verbal consent to proceed.  History of Present Illness   Angel Hernandez is a 54 year old female with moderate mitral valve regurgitation who presents with shortness of breath on exertion.  Exertional dyspnea - Shortness of breath with exertion for approximately six months - No associated cough or wheezing  - Smoking history of 30+ years 1PPD  Cardiac history - Moderate mitral valve regurgitation - History of heart murmur - Recent echocardiogram performed - Blood pressure readings around 130/90 mmHg - Previously recorded blood pressure was 110/50 mmHg  Tobacco use - Smokes five cigarettes per day - Smoking history of 30 to 35 years, beginning in teenage years - Previous quit attempt lasted two months; initial days were particularly challenging - No prior use of nicotine  patches  Medication and supplement use - Takes Lexapro  for several years; experiences 'shock waves' if dose is missed or delayed - Takes turmeric, glucosamine, Ginkgo, and B12 supplements - Glucosamine helps with joint stiffness - Monitors turmeric dosage due to potential liver effects         Medical history:  Relevant past medical, surgical, family and social history reviewed and updated as indicated. Interim medical history since our last visit reviewed.  Allergies and medications reviewed and updated.   ROS: Negative unless specifically indicated above in HPI.   Current Medications[1]       Objective:     BP (!) 130/90 (BP Location: Left Arm, Patient Position: Sitting, Cuff Size:  Large)   Pulse 74   Ht 5' 6 (1.676 m)   Wt 222 lb 9.6 oz (101 kg)   LMP 03/09/2017   SpO2 98%   BMI 35.93 kg/m   Wt Readings from Last 3 Encounters:  08/07/24 222 lb 9.6 oz (101 kg)  06/13/24 221 lb 3.2 oz (100.3 kg)  05/28/24 215 lb (97.5 kg)    Physical Exam  Physical Exam Vitals reviewed.  Constitutional:      Appearance: Normal appearance. Well-developed with normal weight.  Cardiovascular:     Rate and Rhythm: Normal rate and regular rhythm. Normal heart sounds. Normal peripheral pulses Pulmonary:     Normal breath sounds with normal effort Skin:    General: Skin is warm and dry without noticeable rash. Neurological:     General: No focal deficit present.  Psychiatric:        Mood and Affect: Mood, behavior and cognition normal      Assessment & Plan:   Encounter Diagnoses  Name Primary?   Tobacco abuse Yes   Recurrent major depression in full remission    Post-operative hypothyroidism    Class 2 obesity due to excess calories without serious comorbidity with body mass index (BMI) of 35.0 to 35.9 in adult    Thyroid  cancer (HCC)    Plantar warts    History of iron deficiency    Mitral valve insufficiency, unspecified etiology     No orders of the defined types were placed in this encounter.    Assessment and Plan    Mitral valve regurgitation Moderate regurgitation with normal ejection fraction and mild left ventricular hypertrophy. Right atrial pressure 8 mmHg. No immediate cardiology concern, requires monitoring. - Referred to cardiology for further evaluation and management.  CAD likely given smoking history.  Labs including lipids pending - She will need ongoing ECHO follow up in the future  Evaluation of dyspnea Exertional dyspnea for six months.  Suspect pulmonary cause - Ordered chest imaging for pulmonary evaluation. - Ordered pulmonary function testing for COPD assessment. - Ordered blood work for anemia and lipid profile.  Nicotine   dependence - Discussed cessation Long-term smoker, currently five cigarettes per day. Prefers not to use Chantix  due to adverse effects. - Prescribed nicotine  patches starting at 21 mg, with gradual weaning. - Encouraged setting a quit date and using online cessation resources.  General health maintenance Discussed turmeric and glucosamine supplements. Advised monitoring turmeric dosage due to liver effects. - Advised limiting turmeric to one pill per day. - Continue glucosamine supplementation.     F/u in 3-4 weeks will wean nicotine  patches appropriately.              [1]  Current Outpatient Medications:    escitalopram  (LEXAPRO ) 10 MG tablet, Take 1.5 tablets (15 mg total) by mouth daily., Disp: 135 tablet, Rfl: 3   levothyroxine  (SYNTHROID ) 100 MCG tablet, Take 100 mcg by mouth daily before breakfast., Disp: , Rfl:    meclizine  (ANTIVERT ) 12.5 MG tablet, Take 1 tablet (12.5 mg total) by mouth 3 (three) times daily as needed for dizziness., Disp: 30 tablet, Rfl: 0   ipratropium (ATROVENT ) 0.06 % nasal spray, Place 2 sprays into both nostrils 4 (four) times daily. (Patient not taking: Reported on 08/07/2024), Disp: 15 mL, Rfl: 12   valACYclovir  (VALTREX ) 1000 MG tablet, Take 2 tablets (2,000 mg total) by mouth 2 (two) times daily. For 24 hours only for fever blisters (Patient not taking: Reported on 08/07/2024), Disp: 28 tablet, Rfl: 0  "

## 2024-08-13 ENCOUNTER — Encounter: Payer: Self-pay | Admitting: Cardiology

## 2024-08-13 ENCOUNTER — Ambulatory Visit: Attending: Cardiology | Admitting: Cardiology

## 2024-08-13 VITALS — BP 142/86 | HR 67 | Ht 66.0 in | Wt 223.0 lb

## 2024-08-13 DIAGNOSIS — I340 Nonrheumatic mitral (valve) insufficiency: Secondary | ICD-10-CM

## 2024-08-13 DIAGNOSIS — R0609 Other forms of dyspnea: Secondary | ICD-10-CM

## 2024-08-13 DIAGNOSIS — F17200 Nicotine dependence, unspecified, uncomplicated: Secondary | ICD-10-CM | POA: Diagnosis not present

## 2024-08-13 DIAGNOSIS — R030 Elevated blood-pressure reading, without diagnosis of hypertension: Secondary | ICD-10-CM

## 2024-08-13 DIAGNOSIS — I2089 Other forms of angina pectoris: Secondary | ICD-10-CM | POA: Diagnosis not present

## 2024-08-13 MED ORDER — METOPROLOL TARTRATE 50 MG PO TABS: ORAL_TABLET | ORAL | 0 refills | Status: DC

## 2024-08-13 NOTE — Progress Notes (Signed)
 " Cardiology Office Note:    Date:  08/13/2024   ID:  Sari LITTIE Louder, DOB 1971-03-01, MRN [id number]  PCP:  Everlene Parris LABOR, MD   [facility] Providers Cardiologist:  None     Referring MD: Everlene Parris LABOR, MD   Chief Complaint  Patient presents with   Mitral valve insufficiency    Patient states that she experiences shortness of breath with doing things and always feel like she is bloated. Meds reviewed.    Angel Hernandez is a 54 y.o. female who is being seen today for the evaluation of mitral regurgitation at the request of Zafirov, Clarissa A, MD.   History of Present Illness:    Angel Hernandez is a 54 y.o. female with a hx of hypothyroidism (2/2 thyroid  cancer s/p resection), current smoker x 30+ years presenting with mitral regurgitation.  Saw primary care physician for scheduled visit, systolic murmur noted on exam.  Had an echocardiogram performed last month showing moderate mitral regurgitation.  Patient endorses shortness of breath ongoing over the past 6 months, typically with exertion especially going up stairs or an incline.  Denies chest pain.  States older brother has a stent placed, unsure exact location of stent.  Mother has a history of rheumatic fever and pacemaker placement.  Past Medical History:  Diagnosis Date   Cancer (HCC) Thyroid    GERD (gastroesophageal reflux disease)    Hypothyroid    Migraine headache    Thyroid  cancer (HCC) 2001   Tobacco abuse     Past Surgical History:  Procedure Laterality Date   ABDOMINAL HYSTERECTOMY     BILATERAL SALPINGECTOMY Bilateral 03/19/2017   Procedure: BILATERAL SALPINGECTOMY;  Surgeon: Ward, Mitzie BROCKS, MD;  Location: [facility];  Service: Gynecology;  Laterality: Bilateral;   CESAREAN SECTION     CYSTOSCOPY N/A 03/19/2017   Procedure: CYSTOSCOPY;  Surgeon: Ward, Mitzie BROCKS, MD;  Location: [facility];  Service: Gynecology;  Laterality: N/A;   DILATION AND CURETTAGE OF UTERUS     KNEE ARTHROSCOPY  Left    thyroidectomy  2001   VAGINAL HYSTERECTOMY N/A 03/19/2017   Procedure: HYSTERECTOMY VAGINAL;  Surgeon: Ward, Mitzie BROCKS, MD;  Location: [facility];  Service: Gynecology;  Laterality: N/A;    Current Medications: Active Medications[1]   Allergies:   Patient has no known allergies.   Social History   Socioeconomic History   Marital status: Married    Spouse name: Not on file   Number of children: Not on file   Years of education: Not on file   Highest education level: Not on file  Occupational History   Occupation: production designer, theatre/television/film    Employer: [employer]    Comment: [employer] in [place]  Tobacco Use   Smoking status: Every Day    Current packs/day: 0.00    Average packs/day: 0.5 packs/day for 33.0 years (16.5 ttl pk-yrs)    Types: Cigarettes    Start date: 09/07/1988    Last attempt to quit: 09/07/2021    Years since quitting: 2.9   Smokeless tobacco: Never  Vaping Use   Vaping status: Former  Substance and Sexual Activity   Alcohol use: Yes    Comment: social   Drug use: No   Sexual activity: Yes    Partners: Male  Other Topics Concern   Not on file  Social History Narrative   4 children - 20, 17, 12, 9.   Lives in [place]   Works 70+ hours a week as Production Designer, Theatre/television/film  of [employer] in [place]   Social Drivers of Health   Tobacco Use: High Risk (08/13/2024)   Patient History    Smoking Tobacco Use: Every Day    Smokeless Tobacco Use: Never    Passive Exposure: Not on file  Financial Resource Strain: Not on file  Food Insecurity: Not on file  Transportation Needs: Not on file  Physical Activity: Not on file  Stress: Not on file  Social Connections: Not on file  Depression (PHQ2-9): Low Risk (08/07/2024)   Depression (PHQ2-9)    PHQ-2 Score: 0  Alcohol Screen: Not on file  Housing: Unknown (08/23/2023)   Received from [facility]   Epic    Unable to Pay for Housing in the Last Year: Not on file    Number of Times Moved in the Last Year: Not  on file    At any time in the past 12 months, were you homeless or living in a shelter (including now)?: No  Utilities: Not on file  Health Literacy: Not on file     Family History: The patient's family history includes Alzheimer's disease in her father; Cancer in her father. There is no history of Breast cancer.  ROS:   Please see the history of present illness.     All other systems reviewed and are negative.  EKGs/Labs/Other Studies Reviewed:    The following studies were reviewed today:  EKG Interpretation Date/Time:  Wednesday August 13 2024 08:30:27 EST Ventricular Rate:  67 PR Interval:  180 QRS Duration:  80 QT Interval:  388 QTC Calculation: 409 R Axis:   -11  Text Interpretation: Normal sinus rhythm Septal infarct , age undetermined Confirmed by Darliss Rogue ([id number]) on 08/13/2024 8:45:07 AM    Recent Labs: 10/10/2023: ALT 24; BUN 11; Creatinine, Ser 0.76; Hemoglobin 14.6; Platelets 295.0; Potassium 4.8; Sodium 138  Recent Lipid Panel    Component Value Date/Time   CHOL 152 10/10/2023 0913   TRIG 140.0 10/10/2023 0913   HDL 38.00 (L) 10/10/2023 0913   CHOLHDL 4 10/10/2023 0913   VLDL 28.0 10/10/2023 0913   LDLCALC 86 10/10/2023 0913     Risk Assessment/Calculations:            Physical Exam:    VS:  BP (!) 142/86 (BP Location: Left Arm, Patient Position: Sitting, Cuff Size: Large)   Pulse 67   Ht 5' 6 (1.676 m)   Wt 223 lb (101.2 kg)   LMP 03/09/2017   SpO2 98%   BMI 35.99 kg/m     Wt Readings from Last 3 Encounters:  08/13/24 223 lb (101.2 kg)  08/07/24 222 lb 9.6 oz (101 kg)  06/13/24 221 lb 3.2 oz (100.3 kg)     GEN:  Well nourished, well developed in no acute distress HEENT: Normal NECK: No JVD; No carotid bruits CARDIAC: RRR, very faint systolic murmur at apex. RESPIRATORY:  Clear to auscultation without rales, wheezing or rhonchi  ABDOMEN: Soft, non-tender, non-distended MUSCULOSKELETAL:  No edema; No deformity  SKIN: Warm  and dry NEUROLOGIC:  Alert and oriented x 3 PSYCHIATRIC:  Normal affect   ASSESSMENT:    1. Mitral valve insufficiency, unspecified etiology   2. Dyspnea on exertion   3. Current smoker   4. Elevated BP without diagnosis of hypertension   5. Anginal equivalent    PLAN:    In order of problems listed above:  Moderate MR on echo 12/25.  No edema on exam.  Continue monitoring with serial  echocardiograms.  Plan repeat echo in 1 year. Dyspnea on exertion, septal infarct on EKG.  This could be an anginal equivalent.  Obtain coronary CTA to evaluate for obstructive CAD.  pulmonary etiology also possible, workup due to current smoking as per PCP. Current smoker, smoking cessation advised. BP elevated, usually controlled.  Monitor BP at home and keep a log.  Elevation today possibly from being anxious.   Follow-up after CCTA     Medication Adjustments/Labs and Tests Ordered: Current medicines are reviewed at length with the patient today.  Concerns regarding medicines are outlined above.  Orders Placed This Encounter  Procedures   CT CORONARY MORPH W/CTA COR W/SCORE W/CA W/CM &/OR WO/CM   Basic metabolic panel with GFR   EKG 87-[place]   Meds ordered this encounter  Medications   metoprolol  tartrate (LOPRESSOR ) 50 MG tablet    Sig: TAKE 1 TABLET 2 HR PRIOR TO CARDIAC PROCEDURE    Dispense:  1 tablet    Refill:  0    Patient Instructions  Medication Instructions:  - take one metoprolol  two hours prior to cardiac CT   *If you need a refill on your cardiac medications before your next appointment, please call your pharmacy*  Lab Work: Your provider would like for you to have following labs drawn today BMP.   If you have labs (blood work) drawn today and your tests are completely normal, you will receive your results only by: MyChart Message (if you have MyChart) OR A paper copy in the mail If you have any lab test that is abnormal or we need to change your treatment, we will call  you to review the results.  Testing/Procedures:   Your cardiac CT will be scheduled at one of the below locations:    [facility] [address] [place], [place] [address] [phone]  OR   [facility] [address]  [place], [place] [address]  If scheduled at the Heart and Vascular Tower at [address], please enter the parking lot using the [address] entrance and use the FREE valet service at the patient drop-off area. Enter the building and check-in with registration on the main floor.  If scheduled at [facility], please arrive to the Heart and Vascular Center 15 mins early for check-in and test prep.  There is spacious parking and easy access to the radiology department from the [facility] entrance. Please enter here and check-in with the desk attendant.   Please follow these instructions carefully (unless otherwise directed):  An IV will be required for this test and Nitroglycerin will be given.  Hold all erectile dysfunction medications at least 3 days (72 hrs) prior to test. (Ie viagra, cialis, sildenafil, tadalafil, etc)   On the Night Before the Test: Be sure to Drink plenty of water. Do not consume any caffeinated/decaffeinated beverages or chocolate 12 hours prior to your test. Do not take any antihistamines 12 hours prior to your test.  On the Day of the Test: Drink plenty of water until 1 hour prior to the test. Do not eat any food 1 hour prior to test. You may take your regular medications prior to the test.  Take metoprolol  (Lopressor ) two hours prior to test. If you take Furosemide/Hydrochlorothiazide/Spironolactone/Chlorthalidone, please HOLD on the morning of the test. Patients who wear a continuous glucose monitor MUST remove the device prior to scanning. FEMALES- please wear underwire-free bra if available, avoid dresses &  tight clothing      After the  Test: Drink plenty of water. After receiving IV contrast, you may experience a mild flushed feeling. This is normal. On occasion, you may experience a mild rash up to 24 hours after the test. This is not dangerous. If this occurs, you can take Benadryl 25 mg, Zyrtec, Claritin, or Allegra and increase your fluid intake. (Patients taking Tikosyn should avoid Benadryl, and may take Zyrtec, Claritin, or Allegra) If you experience trouble breathing, this can be serious. If it is severe call 911 IMMEDIATELY. If it is mild, please call our office.  We will call to schedule your test 2-4 weeks out understanding that some insurance companies will need an authorization prior to the service being performed.   For more information and frequently asked questions, please visit our website : [email or web address]  For non-scheduling related questions, please contact the cardiac imaging nurse navigator should you have any questions/concerns: Cardiac Imaging Nurse Navigators Direct Office Dial: [phone]   For scheduling needs, including cancellations and rescheduling, please call Brittany, [phone].   Follow-Up: At [facility], you and your health needs are our priority.  As part of our continuing mission to provide you with exceptional heart care, our providers are all part of one team.  This team includes your primary Cardiologist (physician) and Advanced Practice Providers or APPs (Physician Assistants and Nurse Practitioners) who all work together to provide you with the care you need, when you need it.  Your next appointment:   6 week(s)  Provider:   You may see Dr Darliss or one of the following Advanced Practice Providers on your designated Care Team:   Lonni Meager, NP Lesley Maffucci, PA-C Bernardino Bring, PA-C Cadence [place], PA-C Tylene Lunch, NP Barnie Hila, NP    We recommend signing up for the patient portal called MyChart.  Sign up information is  provided on this After Visit Summary.  MyChart is used to connect with patients for Virtual Visits (Telemedicine).  Patients are able to view lab/test results, encounter notes, upcoming appointments, etc.  Non-urgent messages can be sent to your provider as well.   To learn more about what you can do with MyChart, go to [email or web address].              Signed, Redell Darliss, MD  08/13/2024 9:23 AM    [facility]    [1]  Current Meds  Medication Sig   escitalopram  (LEXAPRO ) 10 MG tablet Take 1.5 tablets (15 mg total) by mouth daily.   Ginkgo Biloba (GINKOBA) 40 MG TABS Take by mouth.   levothyroxine  (SYNTHROID ) 100 MCG tablet Take 100 mcg by mouth daily before breakfast.   meclizine  (ANTIVERT ) 12.5 MG tablet Take 1 tablet (12.5 mg total) by mouth 3 (three) times daily as needed for dizziness.   metoprolol  tartrate (LOPRESSOR ) 50 MG tablet TAKE 1 TABLET 2 HR PRIOR TO CARDIAC PROCEDURE   nicotine  (NICODERM CQ ) 21 mg/24hr patch Place 1 patch (21 mg total) onto the skin daily.   "

## 2024-08-13 NOTE — Patient Instructions (Signed)
 Medication Instructions:  - take one metoprolol  two hours prior to cardiac CT   *If you need a refill on your cardiac medications before your next appointment, please call your pharmacy*  Lab Work: Your provider would like for you to have following labs drawn today BMP.   If you have labs (blood work) drawn today and your tests are completely normal, you will receive your results only by: MyChart Message (if you have MyChart) OR A paper copy in the mail If you have any lab test that is abnormal or we need to change your treatment, we will call you to review the results.  Testing/Procedures:   Your cardiac CT will be scheduled at one of the below locations:    [facility] [address] [place], [place] [address] [phone]  OR   [facility] [address]  [place], [place] [address]  If scheduled at the Heart and Vascular Tower at [address], please enter the parking lot using the [address] entrance and use the FREE valet service at the patient drop-off area. Enter the building and check-in with registration on the main floor.  If scheduled at [facility], please arrive to the Heart and Vascular Center 15 mins early for check-in and test prep.  There is spacious parking and easy access to the radiology department from the [facility] entrance. Please enter here and check-in with the desk attendant.   Please follow these instructions carefully (unless otherwise directed):  An IV will be required for this test and Nitroglycerin will be given.  Hold all erectile dysfunction medications at least 3 days (72 hrs) prior to test. (Ie viagra, cialis, sildenafil, tadalafil, etc)   On the Night Before the Test: Be sure to Drink plenty of water. Do not consume any caffeinated/decaffeinated beverages or chocolate 12 hours prior to your test. Do not take any antihistamines 12 hours prior to  your test.  On the Day of the Test: Drink plenty of water until 1 hour prior to the test. Do not eat any food 1 hour prior to test. You may take your regular medications prior to the test.  Take metoprolol  (Lopressor ) two hours prior to test. If you take Furosemide/Hydrochlorothiazide/Spironolactone/Chlorthalidone, please HOLD on the morning of the test. Patients who wear a continuous glucose monitor MUST remove the device prior to scanning. FEMALES- please wear underwire-free bra if available, avoid dresses & tight clothing      After the Test: Drink plenty of water. After receiving IV contrast, you may experience a mild flushed feeling. This is normal. On occasion, you may experience a mild rash up to 24 hours after the test. This is not dangerous. If this occurs, you can take Benadryl 25 mg, Zyrtec, Claritin, or Allegra and increase your fluid intake. (Patients taking Tikosyn should avoid Benadryl, and may take Zyrtec, Claritin, or Allegra) If you experience trouble breathing, this can be serious. If it is severe call 911 IMMEDIATELY. If it is mild, please call our office.  We will call to schedule your test 2-4 weeks out understanding that some insurance companies will need an authorization prior to the service being performed.   For more information and frequently asked questions, please visit our website : [email or web address]  For non-scheduling related questions, please contact the cardiac imaging nurse navigator should you have any questions/concerns: Cardiac Imaging Nurse Navigators Direct Office Dial: [phone]   For scheduling needs, including cancellations and rescheduling,  please call Brittany, [phone].   Follow-Up: At [facility], you and your health needs are our priority.  As part of our continuing mission to provide you with exceptional heart care, our providers are all part of one team.  This team includes your primary Cardiologist  (physician) and Advanced Practice Providers or APPs (Physician Assistants and Nurse Practitioners) who all work together to provide you with the care you need, when you need it.  Your next appointment:   6 week(s)  Provider:   You may see Dr Darliss or one of the following Advanced Practice Providers on your designated Care Team:   Lonni Meager, NP Lesley Maffucci, PA-C Bernardino Bring, PA-C Cadence [place], PA-C Tylene Lunch, NP Barnie Hila, NP    We recommend signing up for the patient portal called MyChart.  Sign up information is provided on this After Visit Summary.  MyChart is used to connect with patients for Virtual Visits (Telemedicine).  Patients are able to view lab/test results, encounter notes, upcoming appointments, etc.  Non-urgent messages can be sent to your provider as well.   To learn more about what you can do with MyChart, go to [email or web address].

## 2024-08-14 LAB — BASIC METABOLIC PANEL WITH GFR
BUN/Creatinine Ratio: 16 (ref 9–23)
BUN: 14 mg/dL (ref 6–24)
CO2: 22 mmol/L (ref 20–29)
Calcium: 10.2 mg/dL (ref 8.7–10.2)
Chloride: 102 mmol/L (ref 96–106)
Creatinine, Ser: 0.87 mg/dL (ref 0.57–1.00)
Glucose: 82 mg/dL (ref 70–99)
Potassium: 5 mmol/L (ref 3.5–5.2)
Sodium: 140 mmol/L (ref 134–144)
eGFR: 81 mL/min/1.73

## 2024-08-15 ENCOUNTER — Other Ambulatory Visit: Payer: Self-pay

## 2024-08-15 MED ORDER — METOPROLOL TARTRATE 50 MG PO TABS: ORAL_TABLET | ORAL | 0 refills | Status: AC

## 2024-08-18 ENCOUNTER — Other Ambulatory Visit

## 2024-08-19 ENCOUNTER — Other Ambulatory Visit: Payer: Self-pay

## 2024-08-19 DIAGNOSIS — R0602 Shortness of breath: Secondary | ICD-10-CM

## 2024-08-19 LAB — COMPREHENSIVE METABOLIC PANEL WITH GFR
AG Ratio: 1.9 (calc) (ref 1.0–2.5)
ALT: 22 U/L (ref 6–29)
AST: 18 U/L (ref 10–35)
Albumin: 4.6 g/dL (ref 3.6–5.1)
Alkaline phosphatase (APISO): 80 U/L (ref 37–153)
BUN: 14 mg/dL (ref 7–25)
CO2: 29 mmol/L (ref 20–32)
Calcium: 10 mg/dL (ref 8.6–10.4)
Chloride: 105 mmol/L (ref 98–110)
Creat: 0.79 mg/dL (ref 0.50–1.03)
Globulin: 2.4 g/dL (ref 1.9–3.7)
Glucose, Bld: 91 mg/dL (ref 65–99)
Potassium: 4.9 mmol/L (ref 3.5–5.3)
Sodium: 139 mmol/L (ref 135–146)
Total Bilirubin: 0.4 mg/dL (ref 0.2–1.2)
Total Protein: 7 g/dL (ref 6.1–8.1)
eGFR: 91 mL/min/1.73m2

## 2024-08-19 LAB — CBC WITH DIFFERENTIAL/PLATELET
Absolute Lymphocytes: 2352 {cells}/uL (ref 850–3900)
Absolute Monocytes: 560 {cells}/uL (ref 200–950)
Basophils Absolute: 119 {cells}/uL (ref 0–200)
Basophils Relative: 1.7 %
Eosinophils Absolute: 168 {cells}/uL (ref 15–500)
Eosinophils Relative: 2.4 %
HCT: 44.5 % (ref 35.9–46.0)
Hemoglobin: 14.9 g/dL (ref 11.7–15.5)
MCH: 31.7 pg (ref 27.0–33.0)
MCHC: 33.5 g/dL (ref 31.6–35.4)
MCV: 94.7 fL (ref 81.4–101.7)
MPV: 10.9 fL (ref 7.5–12.5)
Monocytes Relative: 8 %
Neutro Abs: 3801 {cells}/uL (ref 1500–7800)
Neutrophils Relative %: 54.3 %
Platelets: 361 Thousand/uL (ref 140–400)
RBC: 4.7 Million/uL (ref 3.80–5.10)
RDW: 11.9 % (ref 11.0–15.0)
Total Lymphocyte: 33.6 %
WBC: 7 Thousand/uL (ref 3.8–10.8)

## 2024-08-19 LAB — IRON,TIBC AND FERRITIN PANEL
%SAT: 32 % (ref 16–45)
Ferritin: 54 ng/mL (ref 16–232)
Iron: 124 ug/dL (ref 45–160)
TIBC: 388 ug/dL (ref 250–450)

## 2024-08-19 LAB — URINALYSIS, ROUTINE W REFLEX MICROSCOPIC
Bilirubin Urine: NEGATIVE
Glucose, UA: NEGATIVE
Hgb urine dipstick: NEGATIVE
Ketones, ur: NEGATIVE
Leukocytes,Ua: NEGATIVE
Nitrite: NEGATIVE
Protein, ur: NEGATIVE
Specific Gravity, Urine: 1.014 (ref 1.001–1.035)
pH: 5.5 (ref 5.0–8.0)

## 2024-08-19 LAB — HEMOGLOBIN A1C
Hgb A1c MFr Bld: 5.6 %
Mean Plasma Glucose: 114 mg/dL
eAG (mmol/L): 6.3 mmol/L

## 2024-08-19 LAB — LIPID PANEL
Cholesterol: 276 mg/dL — ABNORMAL HIGH
HDL: 40 mg/dL — ABNORMAL LOW
LDL Cholesterol (Calc): 197 mg/dL — ABNORMAL HIGH
Non-HDL Cholesterol (Calc): 236 mg/dL — ABNORMAL HIGH
Total CHOL/HDL Ratio: 6.9 (calc) — ABNORMAL HIGH
Triglycerides: 205 mg/dL — ABNORMAL HIGH

## 2024-08-26 ENCOUNTER — Ambulatory Visit

## 2024-08-28 ENCOUNTER — Encounter (HOSPITAL_COMMUNITY): Payer: Self-pay

## 2024-08-28 ENCOUNTER — Ambulatory Visit: Payer: Self-pay | Admitting: Internal Medicine

## 2024-08-28 ENCOUNTER — Ambulatory Visit (HOSPITAL_COMMUNITY)
Admission: RE | Admit: 2024-08-28 | Discharge: 2024-08-28 | Disposition: A | Discharge status: Home / Self Care | Source: Ambulatory Visit

## 2024-08-28 ENCOUNTER — Telehealth: Payer: Self-pay

## 2024-08-28 DIAGNOSIS — R0602 Shortness of breath: Secondary | ICD-10-CM | POA: Insufficient documentation

## 2024-08-28 LAB — PULMONARY FUNCTION TEST
DL/VA % pred: 93 %
DL/VA: 3.97 ml/min/mmHg/L
DLCO cor % pred: 67 %
DLCO cor: 15.08 ml/min/mmHg
DLCO unc % pred: 70 %
DLCO unc: 15.73 ml/min/mmHg
FEF 25-75 Post: 0.86 L/s
FEF 25-75 Pre: 2.11 L/s
FEF2575-%Change-Post: -59 %
FEF2575-%Pred-Post: 30 %
FEF2575-%Pred-Pre: 74 %
FEV1-%Change-Post: -14 %
FEV1-%Pred-Post: 58 %
FEV1-%Pred-Pre: 68 %
FEV1-Post: 1.74 L
FEV1-Pre: 2.04 L
FEV1FVC-%Change-Post: 0 %
FEV1FVC-%Pred-Pre: 101 %
FEV6-%Change-Post: -13 %
FEV6-%Pred-Post: 58 %
FEV6-%Pred-Pre: 67 %
FEV6-Post: 2.14 L
FEV6-Pre: 2.49 L
FEV6FVC-%Pred-Post: 102 %
FEV6FVC-%Pred-Pre: 102 %
FVC-%Change-Post: -14 %
FVC-%Pred-Post: 56 %
FVC-%Pred-Pre: 66 %
FVC-Post: 2.14 L
FVC-Pre: 2.51 L
Post FEV1/FVC ratio: 81 %
Post FEV6/FVC ratio: 100 %
Pre FEV1/FVC ratio: 81 %
Pre FEV6/FVC Ratio: 100 %
RV % pred: 110 %
RV: 2.1 L
TLC % pred: 85 %
TLC: 4.58 L

## 2024-08-28 MED ORDER — ALBUTEROL SULFATE (2.5 MG/3ML) 0.083% IN NEBU
2.5000 mg | INHALATION_SOLUTION | Freq: Once | RESPIRATORY_TRACT | Status: AC
  Administered 2024-08-28: 2.5 mg via RESPIRATORY_TRACT

## 2024-08-28 NOTE — Telephone Encounter (Signed)
 Orthotics in [facility]. Called LM on VM for pt to call to schedule [facility] appt. There is a balance of [phone] left that on message also.

## 2024-09-01 ENCOUNTER — Ambulatory Visit (HOSPITAL_COMMUNITY): Admission: RE | Admit: 2024-09-01 | Discharge: 2024-09-01 | Attending: Cardiology

## 2024-09-01 ENCOUNTER — Ambulatory Visit: Admission: RE | Admit: 2024-09-01 | Source: Ambulatory Visit

## 2024-09-01 ENCOUNTER — Ambulatory Visit: Payer: Self-pay | Admitting: Cardiology

## 2024-09-01 DIAGNOSIS — R0609 Other forms of dyspnea: Secondary | ICD-10-CM

## 2024-09-01 DIAGNOSIS — I2089 Other forms of angina pectoris: Secondary | ICD-10-CM

## 2024-09-01 MED ORDER — NITROGLYCERIN 0.4 MG SL SUBL
0.8000 mg | SUBLINGUAL_TABLET | Freq: Once | SUBLINGUAL | Status: AC
  Administered 2024-09-01: 0.8 mg via SUBLINGUAL

## 2024-09-01 MED ORDER — IOHEXOL 350 MG/ML SOLN
100.0000 mL | Freq: Once | INTRAVENOUS | Status: AC | PRN
  Administered 2024-09-01: 100 mL via INTRAVENOUS

## 2024-09-04 ENCOUNTER — Ambulatory Visit

## 2024-09-04 NOTE — Progress Notes (Unsigned)
 "  Subjective:    Patient ID: Angel Hernandez, female    DOB: 06-25-1971, 54 y.o.   MRN: [id number]  HPI  Patient presents to clinic today to establish care and for management of the conditions listed below.  She is transferring care from Dr. Zafirov.  Hypothyroidism: Secondary to surgical excision due to thyroid  cancer.  She denies any issues on her current dose of levothyroxine .  She follows with endocrinology.  GERD: Triggered by.  She takes as needed with good relief of symptoms.  There is no upper GI on file.  Depression (moderate, recurrent): Chronic, managed on escitalopram .  She is not currently seeing a therapist.  She denies depression, SI/HI.  History of cold sores: Managed with valacyclovir  only as needed.  Chronic back pain: She takes as needed with good relief of symptoms.  X-ray thoracic spine from 05/2020 and x-ray lumbar spine from 02/2018 reviewed.  She does not follow with orthopedics or neurosurgery.  HLD: Her last LDL was 197, triglycerides 205, 07/2024.  She is not taking any cholesterol-lowering medication at this time.  She tries to consume a low-fat diet.  Prediabetes: Her last A1c was 5.6%, 07/2024.  She is not taking any oral diabetic medications time.  She does not check her sugars.  History of vulvar cancer: s/p excision. She no longer follows with GYN.  Current Outpatient Medications  Medication Sig Dispense Refill   escitalopram  (LEXAPRO ) 10 MG tablet Take 1.5 tablets (15 mg total) by mouth daily. 135 tablet 3   Ginkgo Biloba (GINKOBA) 40 MG TABS Take by mouth.     ipratropium (ATROVENT ) 0.06 % nasal spray Place 2 sprays into both nostrils 4 (four) times daily. (Patient not taking: Reported on 08/13/2024) 15 mL 12   levothyroxine  (SYNTHROID ) 100 MCG tablet Take 100 mcg by mouth daily before breakfast.     meclizine  (ANTIVERT ) 12.5 MG tablet Take 1 tablet (12.5 mg total) by mouth 3 (three) times daily as needed for dizziness. 30 tablet 0   metoprolol  tartrate  (LOPRESSOR ) 50 MG tablet TAKE 1 TABLET 2 HR PRIOR TO CARDIAC PROCEDURE 1 tablet 0   nicotine  (NICODERM CQ ) 21 mg/24hr patch Place 1 patch (21 mg total) onto the skin daily. 28 patch 0   valACYclovir  (VALTREX ) 1000 MG tablet Take 2 tablets (2,000 mg total) by mouth 2 (two) times daily. For 24 hours only for fever blisters (Patient not taking: Reported on 08/13/2024) 28 tablet 0   No current facility-administered medications for this visit.    Allergies[1]  Family History  Problem Relation Age of Onset   Alzheimer's disease Father    Cancer Father    Breast cancer Neg Hx     Social History   Socioeconomic History   Marital status: Married    Spouse name: Not on file   Number of children: Not on file   Years of education: Not on file   Highest education level: Not on file  Occupational History   Occupation: production designer, theatre/television/film    Employer: [employer]    Comment: [employer] in [place]  Tobacco Use   Smoking status: Every Day    Current packs/day: 0.00    Average packs/day: 0.5 packs/day for 33.0 years (16.5 ttl pk-yrs)    Types: Cigarettes    Start date: 09/07/1988    Last attempt to quit: 09/07/2021    Years since quitting: 2.9   Smokeless tobacco: Never  Vaping Use   Vaping status: Former  Substance and Sexual Activity  Alcohol use: Yes    Comment: social   Drug use: No   Sexual activity: Yes    Partners: Male  Other Topics Concern   Not on file  Social History Narrative   4 children - 20, 15, 4, 9.   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, [employer] in [place]   Social Drivers of Health   Tobacco Use: High Risk (08/13/2024)   Patient History    Smoking Tobacco Use: Every Day    Smokeless Tobacco Use: Never    Passive Exposure: Not on file  Financial Resource Strain: Not on file  Food Insecurity: Not on file  Transportation Needs: Not on file  Physical Activity: Not on file  Stress: Not on file  Social Connections: Not on file  Intimate  Partner Violence: Not on file  Depression (PHQ2-9): Low Risk (08/07/2024)   Depression (PHQ2-9)    PHQ-2 Score: 0  Alcohol Screen: Not on file  Housing: Unknown (08/27/2024)   Received from [facility]   Epic    Unable to Pay for Housing in the Last Year: Not on file    Number of Times Moved in the Last Year: Not on file    At any time in the past 12 months, were you homeless or living in a shelter (including now)?: No  Utilities: Not on file  Health Literacy: Not on file     Constitutional: Denies fever, malaise, fatigue, headache or abrupt weight changes.  HEENT: Denies eye pain, eye redness, ear pain, ringing in the ears, wax buildup, runny nose, nasal congestion, bloody nose, or sore throat. Respiratory: Denies difficulty breathing, shortness of breath, cough or sputum production.   Cardiovascular: Denies chest pain, chest tightness, palpitations or swelling in the hands or feet.  Gastrointestinal: Denies abdominal pain, bloating, constipation, diarrhea or blood in the stool.  GU: Denies urgency, frequency, pain with urination, burning sensation, blood in urine, odor or discharge. Musculoskeletal: Patient reports chronic back pain.  Denies decrease in range of motion, difficulty with gait, muscle pain or joint pain and swelling.  Skin: Denies redness, rashes, lesions or ulcercations.  Neurological: Denies dizziness, difficulty with memory, difficulty with speech or problems with balance and coordination.  Psych: Patient has a history of depression.  Denies anxiety, SI/HI.  No other specific complaints in a complete review of systems (except as listed in HPI above).  Objective    LMP 03/09/2017  Wt Readings from Last 3 Encounters:  08/13/24 223 lb (101.2 kg)  08/07/24 222 lb 9.6 oz (101 kg)  06/13/24 221 lb 3.2 oz (100.3 kg)    General: Appears their stated age, well developed, well nourished in NAD. Skin: Warm, dry and intact. No rashes, lesions or  ulcerations noted. HEENT: Head: normal shape and size; Eyes: sclera white, no icterus, conjunctiva pink, PERRLA and EOMs intact; Ears: Tm's gray and intact, normal light reflex; Nose: mucosa pink and moist, septum midline; Throat/Mouth: Teeth present, mucosa pink and moist, no exudate, lesions or ulcerations noted.  Neck:  Neck supple, trachea midline. No masses, lumps or thyromegaly present.  Cardiovascular: Normal rate and rhythm. S1,S2 noted.  No murmur, rubs or gallops noted. No JVD or BLE edema. No carotid bruits noted. Pulmonary/Chest: Normal effort and positive vesicular breath sounds. No respiratory distress. No wheezes, rales or ronchi noted.  Abdomen: Soft and nontender. Normal bowel sounds. No distention or masses noted. Liver, spleen and kidneys non palpable. Musculoskeletal: Normal range  of motion. No signs of joint swelling. No difficulty with gait.  Neurological: Alert and oriented. Cranial nerves II-XII grossly intact. Coordination normal.  Psychiatric: Mood and affect normal. Behavior is normal. Judgment and thought content normal.    BMET    Component Value Date/Time   NA 139 08/18/2024 0758   NA 140 08/13/2024 0923   K 4.9 08/18/2024 0758   CL 105 08/18/2024 0758   CO2 29 08/18/2024 0758   GLUCOSE 91 08/18/2024 0758   BUN 14 08/18/2024 0758   BUN 14 08/13/2024 0923   CREATININE 0.79 08/18/2024 0758   CALCIUM  10.0 08/18/2024 0758   GFRNONAA >60 11/26/2018 1605   GFRAA >60 11/26/2018 1605    Lipid Panel     Component Value Date/Time   CHOL 276 (H) 08/18/2024 0758   TRIG 205 (H) 08/18/2024 0758   HDL 40 (L) 08/18/2024 0758   CHOLHDL 6.9 (H) 08/18/2024 0758   VLDL 28.0 10/10/2023 0913   LDLCALC 197 (H) 08/18/2024 0758    CBC    Component Value Date/Time   WBC 7.0 08/18/2024 0758   RBC 4.70 08/18/2024 0758   HGB 14.9 08/18/2024 0758   HGB 14.0 11/03/2013 1724   HCT 44.5 08/18/2024 0758   HCT 42.1 11/03/2013 1724   PLT 361 08/18/2024 0758   PLT 287  11/03/2013 1724   MCV 94.7 08/18/2024 0758   MCV 93 11/03/2013 1724   MCH 31.7 08/18/2024 0758   MCHC 33.5 08/18/2024 0758   RDW 11.9 08/18/2024 0758   RDW 12.8 11/03/2013 1724   LYMPHSABS 2.3 10/10/2023 0913   LYMPHSABS 2.5 11/03/2013 1724   MONOABS 0.5 10/10/2023 0913   MONOABS 0.7 11/03/2013 1724   EOSABS 168 08/18/2024 0758   EOSABS 0.1 11/03/2013 1724   BASOSABS 119 08/18/2024 0758   BASOSABS 0.1 11/03/2013 1724    Hgb A1C Lab Results  Component Value Date   HGBA1C 5.6 08/18/2024       Assessment and Plan       RTC in 6 months for your annual exam Angeline Laura, NP     [1] No Known Allergies  "

## 2024-09-05 ENCOUNTER — Ambulatory Visit: Admitting: Internal Medicine

## 2024-09-09 ENCOUNTER — Other Ambulatory Visit: Payer: Self-pay

## 2024-09-18 ENCOUNTER — Ambulatory Visit: Admitting: Internal Medicine

## 2024-09-24 ENCOUNTER — Ambulatory Visit: Admitting: Cardiology

## 2024-10-07 ENCOUNTER — Other Ambulatory Visit

## 2024-10-14 ENCOUNTER — Ambulatory Visit
